# Patient Record
Sex: Male | Born: 1940
Health system: Southern US, Community
[De-identification: ages and names within clinical notes are randomized; demographics above are authoritative.]

## PROBLEM LIST (undated history)

## (undated) DIAGNOSIS — E7800 Pure hypercholesterolemia, unspecified: Secondary | ICD-10-CM

## (undated) DIAGNOSIS — N400 Enlarged prostate without lower urinary tract symptoms: Secondary | ICD-10-CM

## (undated) DIAGNOSIS — I10 Essential (primary) hypertension: Secondary | ICD-10-CM

## (undated) DIAGNOSIS — I4892 Unspecified atrial flutter: Secondary | ICD-10-CM

## (undated) HISTORY — DX: Essential (primary) hypertension: I10

## (undated) HISTORY — DX: Pure hypercholesterolemia, unspecified: E78.00

## (undated) HISTORY — PX: CARDIOVERSION: SHX1299

## (undated) HISTORY — DX: Benign prostatic hyperplasia without lower urinary tract symptoms: N40.0

## (undated) HISTORY — DX: Unspecified atrial flutter: I48.92

---

## 2001-12-02 ENCOUNTER — Emergency Department (HOSPITAL_COMMUNITY): Admission: EM | Admit: 2001-12-02 | Discharge: 2001-12-02 | Payer: Self-pay | Admitting: *Deleted

## 2001-12-09 ENCOUNTER — Inpatient Hospital Stay (HOSPITAL_COMMUNITY): Admission: EM | Admit: 2001-12-09 | Discharge: 2001-12-11 | Payer: Self-pay | Admitting: Emergency Medicine

## 2001-12-09 ENCOUNTER — Encounter: Payer: Self-pay | Admitting: Neurology

## 2001-12-13 ENCOUNTER — Inpatient Hospital Stay (HOSPITAL_COMMUNITY): Admission: EM | Admit: 2001-12-13 | Discharge: 2001-12-16 | Payer: Self-pay | Admitting: Emergency Medicine

## 2001-12-13 ENCOUNTER — Encounter: Payer: Self-pay | Admitting: Internal Medicine

## 2001-12-13 ENCOUNTER — Encounter: Payer: Self-pay | Admitting: Pulmonary Disease

## 2001-12-14 ENCOUNTER — Encounter: Payer: Self-pay | Admitting: Pulmonary Disease

## 2001-12-15 ENCOUNTER — Encounter: Payer: Self-pay | Admitting: Neurology

## 2002-01-10 ENCOUNTER — Ambulatory Visit (HOSPITAL_BASED_OUTPATIENT_CLINIC_OR_DEPARTMENT_OTHER): Admission: RE | Admit: 2002-01-10 | Discharge: 2002-01-10 | Payer: Self-pay | Admitting: *Deleted

## 2008-05-12 ENCOUNTER — Ambulatory Visit: Payer: Self-pay | Admitting: Internal Medicine

## 2008-05-12 ENCOUNTER — Emergency Department (HOSPITAL_COMMUNITY): Admission: EM | Admit: 2008-05-12 | Discharge: 2008-05-12 | Payer: Self-pay | Admitting: Emergency Medicine

## 2008-05-15 ENCOUNTER — Ambulatory Visit: Payer: Self-pay | Admitting: Internal Medicine

## 2008-05-18 ENCOUNTER — Encounter: Payer: Self-pay | Admitting: Internal Medicine

## 2008-05-18 ENCOUNTER — Ambulatory Visit (HOSPITAL_COMMUNITY): Admission: RE | Admit: 2008-05-18 | Discharge: 2008-05-19 | Payer: Self-pay | Admitting: Internal Medicine

## 2008-05-18 ENCOUNTER — Ambulatory Visit: Payer: Self-pay | Admitting: Internal Medicine

## 2008-05-22 ENCOUNTER — Ambulatory Visit: Payer: Self-pay | Admitting: Cardiology

## 2008-05-25 ENCOUNTER — Ambulatory Visit: Payer: Self-pay | Admitting: Cardiology

## 2008-06-05 ENCOUNTER — Ambulatory Visit: Payer: Self-pay | Admitting: Cardiology

## 2008-06-12 ENCOUNTER — Ambulatory Visit: Payer: Self-pay | Admitting: Cardiovascular Disease

## 2008-06-15 ENCOUNTER — Ambulatory Visit: Payer: Self-pay | Admitting: Internal Medicine

## 2008-08-03 ENCOUNTER — Ambulatory Visit: Payer: Self-pay | Admitting: Internal Medicine

## 2009-08-03 ENCOUNTER — Encounter: Admission: RE | Admit: 2009-08-03 | Discharge: 2009-08-03 | Payer: Self-pay | Admitting: Family Medicine

## 2009-08-15 ENCOUNTER — Encounter: Admission: RE | Admit: 2009-08-15 | Discharge: 2009-08-15 | Payer: Self-pay | Admitting: Neurological Surgery

## 2009-08-23 ENCOUNTER — Ambulatory Visit (HOSPITAL_COMMUNITY): Admission: RE | Admit: 2009-08-23 | Discharge: 2009-08-24 | Payer: Self-pay | Admitting: Neurological Surgery

## 2009-11-24 HISTORY — PX: CERVICAL SPINE SURGERY: SHX589

## 2009-11-29 ENCOUNTER — Ambulatory Visit (HOSPITAL_COMMUNITY): Admission: RE | Admit: 2009-11-29 | Discharge: 2009-11-29 | Payer: Self-pay | Admitting: General Surgery

## 2010-03-28 ENCOUNTER — Encounter: Admission: RE | Admit: 2010-03-28 | Discharge: 2010-03-28 | Payer: Self-pay | Admitting: Neurological Surgery

## 2010-12-16 ENCOUNTER — Encounter: Payer: Self-pay | Admitting: Family Medicine

## 2011-02-09 LAB — COMPREHENSIVE METABOLIC PANEL
ALT: 21 U/L (ref 0–53)
AST: 27 U/L (ref 0–37)
Albumin: 3.9 g/dL (ref 3.5–5.2)
Alkaline Phosphatase: 72 U/L (ref 39–117)
BUN: 15 mg/dL (ref 6–23)
CO2: 33 mEq/L — ABNORMAL HIGH (ref 19–32)
Calcium: 9.3 mg/dL (ref 8.4–10.5)
Chloride: 106 mEq/L (ref 96–112)
Creatinine, Ser: 1.1 mg/dL (ref 0.4–1.5)
GFR calc Af Amer: >60 mL/min (ref >60)
GFR calc non Af Amer: >60 mL/min (ref >60)
Glucose, Bld: 151 mg/dL — ABNORMAL HIGH (ref 70–99)
Potassium: 4.7 mEq/L (ref 3.5–5.1)
Sodium: 145 mEq/L (ref 135–145)
Total Bilirubin: 0.8 mg/dL (ref 0.3–1.2)
Total Protein: 7.2 g/dL (ref 6.0–8.3)

## 2011-02-09 LAB — DIFFERENTIAL
Basophils Absolute: 0.1 10*3/uL (ref 0.0–0.1)
Basophils Relative: 1 % (ref 0–1)
Eosinophils Absolute: 0.1 10*3/uL (ref 0.0–0.7)
Eosinophils Relative: 2 % (ref 0–5)
Lymphocytes Relative: 25 % (ref 12–46)
Lymphs Abs: 1.1 10*3/uL (ref 0.7–4.0)
Monocytes Absolute: 0.3 10*3/uL (ref 0.1–1.0)
Monocytes Relative: 6 % (ref 3–12)
Neutro Abs: 2.9 10*3/uL (ref 1.7–7.7)
Neutrophils Relative %: 66 % (ref 43–77)

## 2011-02-09 LAB — CBC
HCT: 45.7 % (ref 39.0–52.0)
Hemoglobin: 15.3 g/dL (ref 13.0–17.0)
MCHC: 33.5 g/dL (ref 30.0–36.0)
MCV: 93.1 fL (ref 78.0–100.0)
Platelets: 160 10*3/uL (ref 150–400)
RBC: 4.9 MIL/uL (ref 4.22–5.81)
RDW: 13.2 % (ref 11.5–15.5)
WBC: 4.4 10*3/uL (ref 4.0–10.5)

## 2011-02-28 LAB — BASIC METABOLIC PANEL
BUN: 20 mg/dL (ref 6–23)
CO2: 28 mEq/L (ref 19–32)
Calcium: 8.8 mg/dL (ref 8.4–10.5)
Chloride: 104 mEq/L (ref 96–112)
Creatinine, Ser: 1.08 mg/dL (ref 0.4–1.5)
GFR calc Af Amer: >60 mL/min (ref >60)
GFR calc non Af Amer: >60 mL/min (ref >60)
Glucose, Bld: 101 mg/dL — ABNORMAL HIGH (ref 70–99)
Potassium: 4.2 mEq/L (ref 3.5–5.1)
Sodium: 138 mEq/L (ref 135–145)

## 2011-02-28 LAB — DIFFERENTIAL
Basophils Absolute: 0 10*3/uL (ref 0.0–0.1)
Basophils Relative: 0 % (ref 0–1)
Eosinophils Absolute: 0.4 10*3/uL (ref 0.0–0.7)
Eosinophils Relative: 8 % — ABNORMAL HIGH (ref 0–5)
Lymphocytes Relative: 27 % (ref 12–46)
Lymphs Abs: 1.4 10*3/uL (ref 0.7–4.0)
Monocytes Absolute: 0.4 10*3/uL (ref 0.1–1.0)
Monocytes Relative: 7 % (ref 3–12)
Neutro Abs: 3 10*3/uL (ref 1.7–7.7)
Neutrophils Relative %: 57 % (ref 43–77)

## 2011-02-28 LAB — CBC
HCT: 43.6 % (ref 39.0–52.0)
Hemoglobin: 15 g/dL (ref 13.0–17.0)
MCHC: 34.4 g/dL (ref 30.0–36.0)
MCV: 92.8 fL (ref 78.0–100.0)
Platelets: 191 10*3/uL (ref 150–400)
RBC: 4.7 MIL/uL (ref 4.22–5.81)
RDW: 14.1 % (ref 11.5–15.5)
WBC: 5.2 10*3/uL (ref 4.0–10.5)

## 2011-02-28 LAB — APTT: aPTT: 25 seconds (ref 24–37)

## 2011-02-28 LAB — PROTIME-INR
INR: 1 (ref 0.00–1.49)
Prothrombin Time: 12.7 seconds (ref 11.6–15.2)

## 2011-04-08 NOTE — Assessment & Plan Note (Signed)
[facility]                         ELECTROPHYSIOLOGY OFFICE NOTE   DUAYNE, BRIDEAU                       MRN:          [id number]  DATE:08/03/2008                            DOB:          January 10, 1941    Mr. Gulley returns today for followup.  He is a very pleasant 70 year old  male with atrial flutter status post catheter ablation which was carried  out several months ago.  On postprocedure, the patient continued to  experience severe fatigue and weakness for unclear reasons.  Since then,  he has improved.  He has been under much stress and then wonders whether  or not stress played a roll on his feelings of fatigue and malaise.  His  main complaint today is not that of palpitations, but rather difficulty  with sleeping and problems with excessive nocturia, typically x3 or 4  per night.  He states that typically about 3 or 4 in the morning, he  will wake up to urinate and then cannot go back to sleep.  The patient  had no specific complaints; otherwise, denied chest pain or shortness of  breath.   CURRENT MEDICATIONS:  1. Simvastatin 20 a day.  2. Multiple vitamin.  3. HCTZ 25 a day.  4. Sotalol 80 twice a day.   PHYSICAL EXAMINATION:  GENERAL:  He is a pleasant well-appearing man in  no acute distress.  VITAL SIGNS:  The blood pressure today was 120/68, the pulse was 54 and  regular, respirations were 18, and weight was 219 pounds.  NECK:  No jugular venous distention.  LUNGS:  Clear bilaterally to auscultation.  No wheezes, rales, or  rhonchi are present.  CARDIOVASCULAR:  Regular rate and rhythm.  Normal S1 and S2.  EXTREMITIES:  No edema.   Pacemaker EKG demonstrates sinus bradycardia.   IMPRESSION:  1. Atrial flutter status post ablation.  2. Sinus bradycardia (asymptomatic).  3. Hypertension, now well controlled.   DISCUSSION:  Mr. Olenik has improved.  I have asked that he followup with  Dr. Theresia Bough for additional treatment of  his prostate hypertrophy and  nocturia.  With regard to his difficulty with sleeping, I will defer  that to Dr. Theresia Bough as well.  I will plan to see him back on a p.r.n.  basis.       Doylene Canning. Ladona Ridgel, MD  Electronically Signed    GWT/MedQ  DD: 08/03/2008  DT: 08/04/2008  Job #: [id number]

## 2011-04-08 NOTE — Assessment & Plan Note (Signed)
[facility]                         ELECTROPHYSIOLOGY OFFICE NOTE   HUXTON, GLAUS                       MRN:          [id number]  DATE:06/15/2008                            DOB:          [birth year]    [name] returns today for followup.  He is a very pleasant 70 year old  man who has a history of hypertension, dyslipidemia, and migraine  headaches, who was found to have atrial flutter back in May.  He  subsequently underwent catheter ablation of his atrial flutter after  transesophageal echo demonstrated no left atrial appendage thrombus.  He  returns today for followup.  He has had intermittent palpitations since  his ablation was carried out, otherwise he has been stable.  He notes  that for the last several days, however, that he has felt fatigue and  weak.  He has had lack of energy and overall just having feelings of  malaise.  He notes shortness of breath with exertion.  He has had some  sensations of nausea and sickness with his stomach.  He denies frank  fevers and chills.   MEDICATIONS:  1. Coumadin as directed.  2. Metoprolol 50 twice a day.  3. Simvastatin 20 a day.  4. HCTZ 25 a day.  5. Metoprolol 50 mg daily.   PHYSICAL EXAMINATION:  GENERAL:  He is a pleasant, well-appearing,  middle-aged man who looks somewhat younger than his stated age.  VITAL SIGNS:  Blood pressure was 125/88, pulse was 60 and regular,  respirations were 18, and weight was 222 pounds.  NECK:  Revealed no jugular venous distention.  LUNGS:  Clear bilaterally to auscultation.  No wheezes, rales, or  rhonchi are present.  CARDIOVASCULAR:  Regular rate and rhythm.  Normal S1 and S2.  PMI was  not enlarged nor was it laterally displaced.  ABDOMEN:  Soft, nontender, and nondistended.  There is no organomegaly  present.  EXTREMITIES:  Demonstrated no cyanosis, clubbing, or edema.  Pulses are  2+ and symmetric.   IMAGING:  EKG today demonstrates sinus  rhythm with sinus bradycardia.  Normal axis and intervals.  The patient because of his above symptoms  was walked with pulse oximetry.  His heart rate increased from the 60s  to the high 70s.  His oxygen saturation remained in the mid 90s without  decrease.   IMPRESSION:  1. Atrial flutter status post catheter ablation.  2. Chronic Coumadin therapy.  Plan today would be to stop Coumadin.  3. Feelings of fatigue and malaise and shortness of breath.  The      causes of Mr. Lamboy' symptoms of fatigue and malaise are unclear to      me.  He may be still deconditioned.  I have asked that he go      through a period of watchful waiting, and if his symptoms worsen or      if he has high fevers or chills, an additional evaluation would be      required.   We will hold off on blood work today, but if his symptoms do worsen, a  CBC  and a BMP would be recommended.  Today, I have asked that he  decrease his metoprolol to 25 mg taking at nighttime.  Obviously, we  will watch his blood pressure to make sure it does not go up.  I have  asked that he come back to see me in several weeks.     Doylene Canning. Ladona Ridgel, MD  Electronically Signed    GWT/MedQ  DD: 06/15/2008  DT: 06/16/2008  Job #: [id number]

## 2011-04-08 NOTE — Consult Note (Signed)
NAMESIMPSON, PAULOS NO.:  [id number]   MEDICAL RECORD NO.:  [id number]          PATIENT TYPE:  EMS   LOCATION:  MAJO                         FACILITY:  [facility]   PHYSICIAN:  Theodore Demark, PA-C   DATE OF BIRTH:  1941/01/02   DATE OF CONSULTATION:  05/12/2008  DATE OF DISCHARGE:  05/12/2008                                 CONSULTATION   PRIMARY CARE PHYSICIAN:  Dell Ponto, MD, at [facility] on [address].   PRIMARY CARDIOLOGIST:  Doylene Canning. Ladona Ridgel, MD.   CHIEF COMPLAINT:  Palpitations and shortness of breath.   HISTORY OF PRESENT ILLNESS:  Joshua Arroyo is a 70 year old male with no  previous history of coronary artery disease.  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  He stated he felt unusually exhausted and had slight  chest pain as well as palpitations.  Sunday, he felt okay, but Monday at  7 a.m., he was walking which he normally does for exercise and again  experienced extreme shortness of breath with being unable to walk up a  hill without stopping to rest.  He was also noticing palpitations and  some slight chest tightness.  His symptoms improved with rest to the  point that he was able to walk home.  He has been going to work and  functioning, but at a reduced level of exertion because of fatigue and  dyspnea on exertion as well as palpitations.  His activity has been  significantly limited by his symptoms.  He saw his primary care  physician and was started on a beta blocker and scheduled with Dr.  Ladona Ridgel at [facility].  However, he became concerned when his  symptoms did not resolve and he was continuing to feel tired and short  of breath.  He came to the emergency room where he was in atrial flutter  and cardiology was asked to evaluate him.   PAST MEDICAL HISTORY:  1. Hypertension.  2. Hyperlipidemia.  3. Family history of coronary artery disease.  4. Migraines.  5. BPH.  6. Degenerative joint disease.  7. Status post 2-D  echocardiogram in 2003, when being evaluated for      headaches which showed an EF of 55-60% and mild aortic      regurgitation.   SURGICAL HISTORY:  None.   ALLERGIES:  None.   CURRENT MEDICATIONS:  1. Advil 200 mg nightly.  2. Zocor 20 mg a day.  3. HCTZ 25 mg a day.  4. Metoprolol 50 mg b.i.d. started this week.  5. Lisinopril 40 mg b.i.d., but this has been on hold since the beta      blocker was added.   SOCIAL HISTORY:  He lives in [place] with his wife and works as a  IT trainer.  He denies any history of alcohol, tobacco, or drug abuse, and  tries to walk 45 minutes a day 5 days a week.   FAMILY HISTORY:  His mother died at age 57 with no history of heart  disease.  His father died at age 66 with a  history of coronary artery  disease, but no siblings have heart disease.   REVIEW OF SYSTEMS:  He has had no fevers, chills, or sweats.  The  dyspnea on exertion is as described above, but he has not had orthopnea,  PND, or edema.  He has not had presyncope or syncope.  There has been no  coughing or wheezing.  He has occasional arthralgias.  He denies reflux  symptoms, hematemesis, hemoptysis, or melena.  Full 14-point review of  systems is otherwise negative.   PHYSICAL EXAMINATION:  VITAL SIGNS:  Temperature is 97.0, blood pressure  117/77, pulse 69, respiratory rate 18, O2 saturation 99% on room air.  GENERAL:  He is a well-developed, well-nourished white male in no acute  distress at rest.  HEENT:  Normal.  NECK:  There is no lymphadenopathy, thyromegaly, bruit, or JVD noted.  CVA:  His heart is irregular in rate and rhythm with an S1 and S2, and  no significant murmur, rub, or gallop is noted.  Distal pulses are  intact in all 4 extremities and no femoral bruits are appreciated.  LUNGS:  Essentially clear to auscultation bilaterally.  SKIN:  No rashes or lesions are noted.  ABDOMEN:  Soft and nontender with active bowel sounds.  There is no  splenomegaly by palpation.   EXTREMITIES:  There is no cyanosis, clubbing, or edema noted.  MUSCULOSKELETAL:  There is no joint deformity or effusions and no spine  or CVA tenderness.  NEURO:  He is alert and oriented.  Cranial nerves II through XII grossly  intact.   Chest x-ray shows mild bibasilar atelectasis.   EKG is atrial flutter rate 68 with variable conduction.   LABORATORY VALUES:  Hemoglobin 14.9, hematocrit 42.6, WBCs 4.7, and  platelets 181.  Sodium 137, potassium 4.4, chloride 105, BUN 27,  creatinine 1.4, glucose 108.  Point care markers negative x1.   IMPRESSION:  Atrial flutter.  Joshua Arroyo has been seen and examined.  His  symptoms are likely due to the atrial flutter.  However, he does not  appear unstable as his heart rate and blood pressure are well  controlled.  Dr. Kathrynn Speed has treated him appropriately and we will add  only Coumadin to his medication regimen.  Because his blood pressure is  within normal limits, we will continue to hold the lisinopril.  He was  initially scheduled to follow up with EP in July; however, Dr. Ladona Ridgel  had a cancellation on May 15, 2008, and Joshua Arroyo has been given this  appointment.  Dr. Ladona Ridgel cannot determine any further evaluation and  testing needed.  He is a good candidate for a TEE with radiofrequency  catheter ablation, but may need to an echo and Myoview prior to this.  We will also check a TSH.      Theodore Demark, PA-C     RB/MEDQ  D:  05/12/2008  T:  05/13/2008  Job:  [id number]   cc:   Dell Ponto, MD

## 2011-04-08 NOTE — Discharge Summary (Signed)
Joshua Arroyo, Joshua NO.:  [id number]   MEDICAL RECORD NO.:  [id number]          PATIENT TYPE:  OIB   LOCATION:  6533                         FACILITY:  [facility]   PHYSICIAN:  Doylene Canning. Ladona Ridgel, MD    DATE OF BIRTH:  1941/10/30   DATE OF ADMISSION:  05/18/2008  DATE OF DISCHARGE:  05/19/2008                               DISCHARGE SUMMARY   This patient has no known drug allergies.   FINAL DIAGNOSES:  1. Discharging day 1 status post transesophageal echocardiogram which      found no left atrial appendage clot.  2. Discharging day 1 status post electrophysiology study,      radiofrequency catheter ablation of a counterclockwise tricuspid      annulus re-entry atrial flutter.  3. The patient with symptomatic atrial flutter.  4. Identified the patient with short of breath, fatigue, palpitations,      and chest tightness.  5. Emergency room visit June 19 started on Coumadin therapy and      Coumadin subtherapeutic on admission.  Home with Lovenox bridge to      his therapeutic Coumadin protime.  6. 2-D echocardiogram and stress test to be arranged after atrial      flutter ablation at [facility].   SECONDARY DIAGNOSES:  1. Hypertension.  2. Dyslipidemia.  3. Family history of coronary artery disease.  4. Migraines.  5. Benign prostatic hypertrophy.  6. Degenerative joint disease.  7. Echocardiogram 2003, ejection fraction of 55-60%.   PROCEDURE:  May 18, 2008, electrophysiology study, radiofrequency  catheter ablation of typical counterclockwise tricuspid annulus reentry,  atrial flutter, Dr. Lewayne Bunting.  The patient is in sinus rhythm at  discharge and has had no postprocedural complications 24 hours after the  procedure.   BRIEF HISTORY:  Joshua Arroyo is a 70 year old male.  He has a history of  hypertension and dyslipidemia and a family history of coronary artery  disease.  He exercises on a regular basis, but in the last week or so  has noted  increasing dyspnea with exertion.   On June 19, he went to the emergency room.  He was found to be in atrial  flutter with rapid ventricular response.  The patient has been on a beta  blocker.  He was started on Coumadin.  He was referred to see Dr. Ladona Ridgel  an office visit May 15, 2008.  Dr. Ladona Ridgel proposed electrophysiology  study and radiofrequency catheter ablation with a transesophageal  echocardiogram prior to the procedure.  The patient is willing to  undergo this.  The risks and benefits have been described to him.   HOSPITAL COURSE:  The patient presents electively on May 18, 2008.  A  transesophageal echocardiogram was negative for left atrial appendage  thrombus and the electrophysiology study went forward.  A typical  counterclockwise atrial flutter was identified and bidirectional block  placed in the cavotricuspid isthmus by Dr. Lewayne Bunting.  The patient  was started on IV heparin after the procedure.  His Coumadin was  subtherapeutic on admission.  He was given increased doses of  Coumadin  on the day of the procedure, the day after the procedure, and over the  weekend.  He will also be supported with Lovenox 150 mg subcu daily on  Saturday and Sunday.  He was also given a subcu injection prior to  leaving the hospital on June 26.   DISCHARGE MEDICATIONS:  1. Enteric-coated aspirin 321 mg daily.  2. Lovenox prefilled syringes 150 mg daily both Saturday and Sunday.  3. Metoprolol 50 mg a new dose 1/2 tablet in the morning and 1/2      tablet in the evening, and possible wean in the future.  4. Coumadin 5 mg tablets, 2 tablets Saturday June 27, 1-1-1/2 tablet      Sunday, June 28, and then presenting to the Coumadin Clinic Monday,      June 29 at 3:15.  5. Simvastatin 20 mg daily at bedtime.  6. Hydrochlorothiazide 25 mg daily.  7. Multivitamin with iron daily.   Follow-up at [facility], [address];  1. Coumadin Clinic Monday, June 29 at 3:15  p.m.  2. To see Dr. Ladona Ridgel Thursday, July 23, at 9:15.   LABORATORY:  Her studies pertinent to this admission on admission for  the electrophysiology study June 25, protime was 15.1, INR 1.2.  complete blood count:  White cells of 4.3, hemoglobin 15.1, hematocrit  43.6, and platelets of 164.  Serum electrolytes:  Sodium 137, potassium  4.1, chloride 106, carbonate 24, glucose 105, BUN is 20, and creatinine  1.12.      Joshua Mirza, PA      Doylene Canning. Ladona Ridgel, MD  Electronically Signed    GM/MEDQ  D:  05/19/2008  T:  05/20/2008  Job:  [id number]   cc:   Dell Ponto, MD.

## 2011-04-08 NOTE — Op Note (Signed)
NAMENEEL, BUFFONE NO.:  [id number]   MEDICAL RECORD NO.:  [id number]          PATIENT TYPE:  OIB   LOCATION:  6533                         FACILITY:  [facility]   PHYSICIAN:  Doylene Canning. Ladona Ridgel, MD    DATE OF BIRTH:  05/27/1941   DATE OF PROCEDURE:  05/18/2008  DATE OF DISCHARGE:                               OPERATIVE REPORT   PROCEDURE PERFORMED:  Electrophysiology study and RF catheter ablation  of atrial flutter.   HISTORY:  The patient is a 70 year old male with a history of atrial  flutter diagnosed approximately 1 week ago.  He was started on Coumadin.  He underwent transesophageal echo demonstrating no left atrial appendage  thrombus.  He is now referred for catheter ablation.   PROCEDURE:  After informed consent was obtained, the patient was taken  to the diagnostic EP lab in fasting state.  After usual preparation and  draping, intravenous fentanyl and midazolam was given for sedation.  A 6-  [place] hexapolar catheter was inserted percutaneously in the right  jugular vein and advanced to the coronary sinus.  A 7-French multipolar  halo catheter was inserted percutaneously in the right femoral vein and  advanced to right atrium.  A 5-French quadripolar catheter was inserted  percutaneously in the right femoral vein and advanced this to the bundle  region.  After measuring basic intervals, mapping was carried out  demonstrating typical atrial flutter cycle length 250 msec.  A 7-French  quadripolar ablation catheter was then maneuvered into the right atrium  and mapping was again carried out.  This demonstrated a normal size and  orientation of the patient's atrial flutter isthmus.  A total of the 10  RF energy applications were subsequently delivered.  During the fourth  RF energy application, atrial flutter was terminated and sinus rhythm  was restored.  During this RF energy application, initial isthmus block  was demonstrated.  A fifth RF energy  application was delivered and the  patient was observed for approximately 20 minutes at which time, the  patient's flutter isthmus conduction returned.  Five additional bonus RF  energy applications were then delivered resulting in isthmus block.  At  this point, the patient was observed for 30 minutes and during this  time, rapid atrial pacing was carried out from the coronary sinus and  higher atrium.  A pacing cycle lengths of 490 msec and stepwise  decreased down to 40 msec where AV Wenckebach was observed.  Programmed  atrial stimulation was carried out from the coronary sinus with the base  drive cycle length of 045 msec with the S1-S2 interval stepwise  decreased down to 320 msec where AV node ERP was observed.  During  programmed atrial stimulation, there are no AH jumps and no echo beats.  No inducible SVT noted.  Rapid ventricular pacing was carried out from  the right ventricle by way of the ablation catheter, demonstrating VA  dissociation at 600 msec.  At this point, the patient was observed, but  had developed recurrent atrial flutter isthmus conduction and 5  additional RF energy applications  were delivered resulting in recreation  of atrial flutter isthmus block.  At this point, the catheters were  removed.  Hemostasis was assured, and the patient was returned to his  room in satisfactory condition.   COMPLICATIONS:  There were no immediate ECG complications.   RESULTS:  4A.  Baseline ECG.  Baseline ECG demonstrates atrial flutter  with rapid ventricular response.  The atrial flutter cycle length was  250 msec.  4B.  The HV interval was 55 msec.  QRS duration was 115 msec.  4C.  Rapid ventricular pacing:  Following ablation, rapid ventricular  pacing was carried out from the RV apex demonstrating VA dissociation at  600 msec.  4D.  Programmed ventricular stimulation.  Programmed atrial stimulation  was carried out the RV apex at base drive cycle length of 811 msec   demonstrating VA dissociation.  4E.  Rapid atrial pacing:  Following ablation, rapid atrial pacing was  carried out from the coronary sinus and the right atrium demonstrating  AV Wenckebach cycle length of 400 msec.  During rapid atrial pacing, the  P interval was less than the R interval and there was no inducible SVT.  53F.  Programmed atrial stimulation.  Programmed atrial stimulation was  carried out from the coronary sinus and high atrium baseline cycle  length of 500 msec. The S1-S2 interval stepwise decreased down to 320  msec where the AV node ERP was observed.  During programmed atrial  stimulation, there were no AA shunts and echo beats.  4G.  Arrhythmias observed.  Atrial flutter initiation, present at the  time of VP study, duration was sustained, cycle length was 250 msec.  Method of termination was catheter ablation.  4H.  Mapping.  Mapping of the atrial flutter isthmus demonstrated the  usual size and orientation.  4I.  RF energy applications. Total of 10 RF energy applications were  delivered.  During the fourth RF energy application, atrial flutter was  demonstrated and sinus rhythm restored and isthmus block demonstrated.  Bonus RF energy applications was delivered and the patient was observed,  but had recurrence of atrial flutter isthmus conduction at approximately  30 minutes after the ablation.  Five additional RF energy applications  were delivered resulting in creation of isthmus block.   CONCLUSIONS:  This study demonstrates successful electrophysiologic  study and RF catheter ablation, typical atrial flutter with the total of  10 RF energy applications delivered resulting in termination of flutter,  restoration of sinus rhythm, creation of bidirectional block in atrial  flutter isthmus.      Doylene Canning. Ladona Ridgel, MD  Electronically Signed     GWT/MEDQ  D:  05/18/2008  T:  05/19/2008  Job:  [id number]   cc:   Dell Ponto, M.D.

## 2011-04-08 NOTE — Letter (Signed)
May 15, 2008    Dell Ponto, MD  [facility]  [address]  [place], [place]  [address]   RE:  Joshua, Arroyo  MRN:  [id number]  /  DOB:  Oct 04, 1941   Dear Dr. Kathrynn Speed:   Thank you for referring [name] for cardiology and EP  evaluation.  As you know, he is a very pleasant 70 year old man whose  health has been quite good.  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  The patient does have hypertension and dyslipidemia and a  family history of coronary artery disease.  He states that he exercises  on a regular basis, but in the last week or so has noted increasing  dyspnea with exertion.  He was seen in the emergency room at [facility] on May 12, 2008, at which time he was found to be in atrial  flutter with a rapid ventricular response.  The patient has been treated  with beta blocker therapy and started on Coumadin.  He is referred for  additional evaluation.  He notes that over the last week or 2 weeks his  dyspnea with exertion has worsened markedly, although he does note mild  dyspnea of unknown etiology in the past.  He has never had syncope.  He  denies anginal symptoms.   SOCIAL HISTORY:  Notable, the patient is married and has 3 children.  He  denies tobacco use.  He drinks 3 alcoholic beverages per week by his  report.   FAMILY HISTORY:  Notable for both parents being deceased.  His father  had cancer and died in his 4s and mother in her 66s.   REVIEW OF SYSTEMS:  Notable for occasional dyspnea as previously noted  and some anxiety, otherwise, all systems were reviewed and negative  except as noted in the HPI.   PHYSICAL EXAMINATION:  GENERAL:  He is a pleasant 70 year old man, in no  acute distress.  He was somewhat anxious appearing.  VITAL SIGNS:  Blood pressure was 127/81, the pulse was 91 and irregular,  respirations were 18, and weight was 231 pounds.  HEENT:  Normocephalic  and atraumatic.  Pupils were equal and round.   Oropharynx is moist.  Sclerae anicteric.  NECK:  No jugular venous distention. No thyromegaly.  Trachea was  midline.  Carotid 2+ symmetric.  LUNGS:  Clear bilaterally to auscultation.  No wheezes, rales, or  rhonchi are present.  There is no increased work of breathing.  CARDIOVASCULAR:  Irregular rhythm.  Normal S1 and S2.  There are no  murmurs, rubs, or gallops.  The PMI was not enlarged or laterally  displaced.  ABDOMEN:  Soft, nontender, and nondistended.  There is no organomegaly.  Bowel sounds are present.  There is no rebound or guarding.  EXTREMITIES:  No cyanosis, clubbing, or edema.  Pulses are 2+ and  symmetric.  NEUROLOGIC:  Alert and oriented x2.  His cranial nerves are intact.  Strength is 5/5 and symmetric.   EKG demonstrates atrial flutter with a controlled ventricular response  at 91 beats per minute.  The atrial cycle length was approximately 240  milliseconds.   IMPRESSION:  1. Symptomatic atrial flutter.  2. New initiation of Coumadin therapy.  3. Hypertension.  4. Dyslipidemia.   DISCUSSION:  Dr. Kathrynn Speed, I have discussed the treatment options with the  patient.  The risks, benefits, goals, and expectations of  electrophysiologic study and catheter ablation have been discussed with  the patient.  He would like to proceed with this.  He will call us to  schedule at the earliest possible convenient time.  The patient tells me  today that he has been scheduled at your Center to have a 2-D echo and a  stress test to be done.  Because of the difficulties in obtaining the  studies, particularly with relationship to their accuracy in patients  with uncontrolled atrial flutter, my recommendation at this time would  be to go ahead and take care of his flutter and then I will refer him  back to you, and the further decision for echo and stress testing based  on how the patient is doing following his ablation procedure.    Sincerely,      Doylene Canning. Ladona Ridgel, MD   Electronically Signed   Joshua Arroyo  DD: 05/15/2008  DT: 05/16/2008  Job #: [id number]

## 2011-04-11 NOTE — Discharge Summary (Signed)
[place]  Patient:    Joshua Arroyo, Joshua Arroyo Visit Number: [id number] MRN: [id number]          Service Type: Attending:  Lacretia Leigh. Ninetta Lights, M.D. Dictated by:   Gertha Calkin, M.D. Adm. Date:  12/13/01 Disc. Date: 12/16/01                             Discharge Summary  DATE OF BIRTH:  March 11, 1941.  DISCHARGE DIAGNOSES: 1. Migraine headache. 2. Hypertension. 3. Benign prostatic hypertrophy. 4. Degenerative joint disease.  DISCHARGE MEDICATIONS: 1. Imitrex 100 mg p.o. q.d. times six days with onset of headaches. 2. Propranolol 20 mg p.o. t.i.d. 3. Clonazepam 0.5 mg p.o. b.i.d.  Please note above medications were written by Dr. Sandria Manly.  PROCEDURES: 1. Transthoracic echocardiogram with impression (a) ejection fraction 55 to 65%, left ventricular function normal.  No left ventricular wall motion    abnormalities.  Left ventricular wall thickness was upper limits of    normal. (b) Aortic valve thickness was mildly increased. Mild aortic    valvular regurgitation.  Aortic regurgitation was 1 to 2+ on a 0-4+ scale.    (c) Mild mitral valve regurgitation.  This procedure was performed on    12/15/2001. 2. Magnetic resonance imaging of abdomen without contrast, performed on    December 15, 2001:    a. Suggestive of adrenal adenoma left side.  Patient recommended to    have a follow up limited non-contrast CT scan in three months if he has    a history of lung cancer.  However, if no symptomatology secondary to    possible theo, the patient needs no further imaging studies.    b. Small splenic cysts and several small peripelvic cysts of the left    kidney. 3. CT scan head without contrast showing minimal atrophy, questionable    minimal small vessel chronic ischemic changes of cerebral white    matter. No acute intracranial abnormalities. 4. Previous to magnetic resonance imaging CT scan was done 12/15/01 showing    a left lower adrenal nodule measuring  1 cm in size. Magnetic resonance    imaging was performed as follow up to this test.  BRIEF HISTORY OF PRESENT ILLNESS:  The patient is a white male with history of severe headaches discharged on 12/11/01 from [facility] for the same symptoms and returned to the emergency department complaining of recurrent headache involving both temples and eyes, ears.  The onset was about 7 p.m. the night before, unrelieved with Imitrex subcu times two doses and Vicodin two tablets.  This was associated with nausea and vomiting.  On 12/09/01 the patient had a magnetic resonance imaging with MRA showing no ICA. Two weeks of progressive worsening.  Headache varies in location and no associated visual changes, weakness or numbness.  The patient denies any heavy nonsteroidal anti-inflammatory drug use.  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  PAST MEDICAL HISTORY:  Negative for hypertension, coronary artery disease.  MEDICATIONS: 1. Depakote, received in emergency department 500 mg p.o. q.d. 2. Imitrex 500 mg p.o. q.d. one times dose. 3. Imitrex subcu p.r.n.  PHYSICAL EXAMINATION: VITAL SIGNS:  Temperature 97.1, blood pressure 214/103, pulse 61, respirations 20, 92% saturations on room air.  Repeat blood pressure showed manual 151/81.  GENERAL:  Generally a somnolent white male in no acute distress, status post Phenergan 25  and Dilaudid 4.  NEUROLOGICAL:  Alert and oriented, intact times four.  Cranial nerves II-XII were intact.  No weakness or sensory deficits bilateral upper and lower extremities.  Toes are downgoing.  LUNGS:  Clear with good air movement.  CARDIOVASCULAR:  Regular rate and rhythm with normal S1 and S2.  ABDOMEN:  Soft, nontender with good bowel sounds.  LABORATORY DATA:  CBC:  White blood cell count 3.3, hemoglobin 15.8, platelet count 184K.  BMP:  136 sodium, 2.5 potassium, chloride 100, bicarb 29,  BUN 21, creatinine 1.2, glucose 131.  TSH:  2.45.  ESR:  Negative.  HOSPITAL COURSE: #1 - HEADACHE, MIGRAINE PAIN:  Patient likely showing secondary symptoms of hypertension.  The patient was admitted previously and started on short term pain medications for headache, however, his hypertensive diagnosis was unknown.  At this time we started the patient on low dose beta blocker to prophyax for migraines as well as treat his hypertension.  Throughout his hospital course his headaches seemed to correlate with blood pressures that were elevated.  However, the patient also has a past medical history of having migraines.  For this reason we continued his p.r.n. medications for headache pain.  #2 - HYPERTENSION:  See medical regimen above.  #3 - BENIGN PROSTATIC HYPERTROPHY:  This is followed by Dr. Vernie Ammons and refer any further work-up as outpatient basis.  #4 - DEGENERATIVE JOINT DISEASE:  The patient had no complaints and we did not alter any chronic nonsteroidal anti-inflammatory drug medications he may or may not have been using at home. Dictated by:   Gertha Calkin, M.D. Attending:  Lacretia Leigh. Ninetta Lights, M.D. DD:  03/01/02 TD:  03/02/02 Job: [id number]

## 2011-04-11 NOTE — Consult Note (Signed)
[place]  Patient:    Joshua Arroyo, Joshua Arroyo Visit Number: [id number] MRN: [id number]          Service Type: MED Location: 3000 [id number] Attending Physician:  Anise Salvo Dictated by:   Genene Churn. Love, M.D. Proc. Date: 12/09/01 Admit Date:  12/08/2001   CC:         Lionel December, M.D.   Consultation Report  PATIENT ADDRESS:  [address] [place], [place] [address].  DATE OF BIRTH:  07-17-1941.  REASON FOR CONSULTATION:  This 70 year old, right-handed, white, married male is seen at the request of Dr. Karilyn Cota for evaluation of headaches.  HISTORY OF PRESENT ILLNESS:  This patient has had a three to four-week history of waxing and waning severe headaches occurring in the right neck and occipital region but more recently varying in location.  The headaches can occur in the periorbital, bifrontal, and occipital region without associated visual loss but at times with nausea.  A trial of tapering prednisone over the last week has been without benefit.  He has had no associated ptosis, nasal stuffiness, or tearing.  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  He notes no family history of headache.  Over the last four weeks, the headaches have varied in intensity.  He has had a CT scan of the brain, raising the question of a right parietal stroke; an MRI study of the cervical spine showing spondylitic disease at C4-5 and C5-6; an MRI study of the brain which has been reported to show some evidence of small vessel ischemic disease.  The patient has no known family history of aneurysms.  Besides trials of steroids, he has been on over-the-counter medications and recently was tried on nasal oxygen without any benefit.   He notes that he has been under stress and more recently noted increase in headache after visiting his best friends mothers funeral in [place], [place].  MEDICATIONS:  He is currently not on medications.  ALLERGIES:  He has no known drug allergies.  HABIT:  He drinks alcohol socially, does not smoke cigarettes.  PAST MEDICAL HISTORY:  He has had no significant hospitalizations, injuries, or operations.  He has been tried on amitriptyline recently.  PHYSICAL EXAMINATION:  GENERAL:  Well-developed, pleasant, white male in no acute distress.  VITAL SIGNS:  Blood pressure right and left arm 140/80 and 130/80, heart rate 64 and regular.  NECK:   There were no bruits.  Neck flexion and extension maneuvers were unremarkable.  NEUROLOGIC:  Mental Status:  He was alert and oriented x 3, followed one, two, and three-step commands.  Cranial nerve examination revealed visual fields full, disks flat, extraocular movements full, corneals present.  Facial sensation equal.  No facial motor asymmetry.  Hearing present.  Air conduction greater than bone conduction.  Tongue midline, uvula midline.  Gags present. Sternocleidomastoid and trapezius testing normal.  Motor examination revealed 5/5 strength proximally and distally in the upper and lower extremities without any evidence of proximal, pronator, or distal drift.  Coordination testing with finger-to-nose, heel-to-shin, and rapid alternating movements normal.  Sensory examination intact to pinprick, touch, change of position, and vibration testing.  Deep tendon reflexes 1 to 2+, and plantar responses were downgoing.  IMPRESSION:  History of headaches with supposedly recent sedimentation rate and MRI study of the brain which had been normal.  May consider the possibility of MR angiography but will start a protocol for his headaches with DHE, Decadron, and Reglan. Dictated by:   Genene Churn. Love, M.D. Attending Physician:  Anise Salvo DD:  12/09/01 TD:  12/11/01 Job: [id number]

## 2011-04-11 NOTE — Discharge Summary (Signed)
[place]. [facility]  Patient:    Joshua Arroyo, Joshua Arroyo Visit Number: [id number] MRN: [id number]          Service Type: MED Location: 2300 2308 01 Attending Physician:  Alfonso Ramus Dictated by:   Jennette Kettle, M.D. Admit Date:  12/13/2001 Disc. Date: 12/11/01   CC:         Candy Sledge, M.D.  Genene Churn. Love, M.D.  Veverly Fells. Vernie Ammons, M.D.   Discharge Summary  DISCHARGE DIAGNOSES: 1. Migraine headaches. 2. History of hypertension. 3. History of enlarged prostate seen by Dr. Loraine Leriche C. Ottelin. 4. Degenerative disc disease in cervical spine cervical vertebrae-4 through    cervical vertebrae-6 seen by Dr. Burnett Corrente, [facility]. 5. Questionable history of obstruction with sleep apnea. 6. History of erectile dysfunction.  DISCHARGE MEDICATIONS: 1. Depakote ER 500 mg p.o. q.d. 2. Imitrex subcutaneous injections p.r.n. when Mr. Ballin develops a headache.    If no relief, may repeat in one hour but no more than two injections in    a 24-hour period. 3. Reglan 10 mg p.o. p.r.n.  PROCEDURES/DIAGNOSTIC STUDIES:  The patient had a MRA of the brain on December 09, 2001 per Dr. Genene Churn. Love with the impression being limited MRI showing no acute stroke, small vessel-type changes in the cerebellum with white mater of the cerebral hemispheres.  There was no intracranial MR angiography abnormalities with respect to large and medium-sized vessels.  FOLLOW-UP:  The patient is to call [facility] at [phone] for further follow up with Dr. Genene Churn. Love or Dr. Candy Sledge.  The patient is to continue following up with Dr. Loraine Leriche C. Ottelin in urology. The patient currently has no primary care physician and states that he would consider going down to the [facility] to see Dr. Jennette Kettle.  The patient was given telephone number.  HISTORY OF PRESENT ILLNESS:  Briefly, Joshua Arroyo is a 70 year old white  male who presented to the ED on December 08, 2001 with chief complaint of worsening headaches over the past four weeks.  The patient had been seen by Dr. Burnett Corrente for degenerative disc disease in his cervical spine and was recommended for CT and MRI in December 2002.  The patient did obtain these and was seen Dr. Candy Sledge on two occasions prior to the current admission regarding his headaches.  The patient was given prescription for Vicodin at that time.  The patient went to the emergency room on the day of admission with worsening headaches with no relief from the Vicodin.  The patient was previously placed on Prednisone taper by Dr. Candy Sledge for which he took prior to admission with little to no relief.  LABORATORY DATA:  The patient had electrolytes revealing sodium 136, potassium 3.5, chloride 100, bicarb 29, BUN 21, creatinine 1.2, glucose 131, calcium of 8.4.  The patients CBC revealed a white count of 6.3, hemoglobin 15.8, and platelets of 184,000.  The patients TSH was within normal limits at 2.447. The patient did have a sedimentation rate performed on the day prior to admission which was 0 (of note the patient was on prednisone).  The patient had a urinalysis on the day prior to admission which was negative for nitrites and leukocytes as well as glucose, bilirubin, ketones, and blood.  HOSPITAL COURSE: #1 - HEADACHES:  The patient was admitted with severe headaches which have been occurring over the last three to four weeks and has previously  been seen by [facility], Dr. Candy Sledge.  Neurology was reconsulted on this patient and Dr. Genene Churn. Love saw the patient, who thought the patient had migraine headaches.  The patient did have a MRA performed which showed no evidence of any aneurysm.  Prior CT and MRI results were reviewed by Dr. Genene Churn. Love.  In addition to migraines, the patients headaches could be secondary from his degenerative  disc disease in his cervical spine and hormonal related given that he has a history of his erectile dysfunction per Dr. Genene Churn. Loves suggestions.  Furthermore, the patient has been under a significant amount of stress recently in his home life.  This could be contributing to some of his headaches.  The patient was started on DHE protocol for his headaches as well as Decadron. The patient was also placed on Reglan for bowel irritation while on DHE protocol.  The patient was subsequently taught Imitrex injections that he can take at home for acute exacerbations of his migraines.  The patient was discharged home on Depakote 500 mg q.d. per Dr. Genene Churn. Love with further follow up with [facility], Dr. Candy Sledge or Dr. Genene Churn. Sandria Manly.  #2 - HYPERTENSION:  The patient does not have a previous history of hypertension.  The patients blood pressure was somewhat elevated upon admission.  It ranged from the systolics of 150s to 200.  This may be essential hypertension or it may be secondary to his pain from headaches. Further follow up with primary care to re-evaluate his hypertension.  #3 - HISTORY OF ERECTILE DYSFUNCTION:  The patient has previously been given Viagra per Dr. Loraine Leriche C. Ottelin as stated by the patient.  I question as to whether they be some association with the patients hormonal level associated with his headaches.  This was briefly discussed with Dr. Genene Churn. Sandria Manly.  #4 - DEGENERATIVE DISC DISEASE:  The patient is currently seeing a chiropractor, Dr. Burnett Corrente, and the patient desires to return to Dr. Burnett Corrente for further therapy.  #5 - QUESTIONABLE HISTORY OF OBSTRUCTIVE SLEEP APNEA:  The patients body habitus is somewhat suggestive of possible obstructive sleep apnea.  The patient does snore at night and does admit to some daytime somnolence.  Further evaluation as an outpatient is needed. Dictated by:   Jennette Kettle, M.D. Attending Physician:   Alfonso Ramus DD:  12/12/01 TD:  12/13/01 Job: [id number]

## 2011-08-21 LAB — COMPREHENSIVE METABOLIC PANEL
ALT: 22
AST: 23
Albumin: 3.7
Alkaline Phosphatase: 53
BUN: 25 — ABNORMAL HIGH
CO2: 26
Calcium: 8.6
Chloride: 105
Creatinine, Ser: 1.09
GFR calc Af Amer: >60
GFR calc non Af Amer: >60
Glucose, Bld: 106 — ABNORMAL HIGH
Potassium: 4.5
Sodium: 138
Total Bilirubin: 0.9
Total Protein: 6.2

## 2011-08-21 LAB — BASIC METABOLIC PANEL
BUN: 20
CO2: 24
Calcium: 8.2 — ABNORMAL LOW
Chloride: 106
Creatinine, Ser: 1.12
GFR calc Af Amer: >60
GFR calc non Af Amer: >60
Glucose, Bld: 105 — ABNORMAL HIGH
Potassium: 4.1
Sodium: 137

## 2011-08-21 LAB — POCT CARDIAC MARKERS
CKMB, poc: <1 — ABNORMAL LOW
Myoglobin, poc: 60.7
Operator id: 294501
Troponin i, poc: <0.05

## 2011-08-21 LAB — DIFFERENTIAL
Basophils Absolute: 0
Basophils Relative: 1
Eosinophils Absolute: 0.1
Eosinophils Relative: 2
Lymphocytes Relative: 25
Lymphs Abs: 1.2
Monocytes Absolute: 0.4
Monocytes Relative: 8
Neutro Abs: 3.1
Neutrophils Relative %: 65

## 2011-08-21 LAB — CBC
HCT: 38.6 — ABNORMAL LOW
HCT: 42.6
HCT: 43.6
Hemoglobin: 13
Hemoglobin: 14.9
Hemoglobin: 15.1
MCHC: 33.8
MCHC: 34.5
MCHC: 35.1
MCV: 89.4
MCV: 90.1
MCV: 90.4
Platelets: 151
Platelets: 164
Platelets: 181
RBC: 4.28
RBC: 4.71
RBC: 4.88
RDW: 13.5
RDW: 13.9
RDW: 14.4
WBC: 4.3
WBC: 4.7
WBC: 6.1

## 2011-08-21 LAB — POCT I-STAT, CHEM 8
BUN: 27 — ABNORMAL HIGH
Calcium, Ion: 1.03 — ABNORMAL LOW
Chloride: 105
Creatinine, Ser: 1.4
Glucose, Bld: 108 — ABNORMAL HIGH
HCT: 44
Hemoglobin: 15
Potassium: 4.4
Sodium: 137
TCO2: 22

## 2011-08-21 LAB — PROTIME-INR
INR: 1.1
INR: 1.2
INR: 1.3
Prothrombin Time: 14
Prothrombin Time: 15.1
Prothrombin Time: 16 — ABNORMAL HIGH

## 2011-08-21 LAB — TSH: TSH: 1.871

## 2011-08-21 LAB — CK TOTAL AND CKMB (NOT AT ARMC)
CK, MB: 2.9
Relative Index: 1.7
Total CK: 170

## 2011-08-21 LAB — TROPONIN I: Troponin I: 0.03

## 2011-08-21 LAB — HEPARIN LEVEL (UNFRACTIONATED)
Heparin Unfractionated: 0.52
Heparin Unfractionated: <0.1 — ABNORMAL LOW

## 2011-08-21 LAB — APTT
aPTT: 25
aPTT: 27

## 2012-11-30 ENCOUNTER — Other Ambulatory Visit: Payer: Self-pay | Admitting: Diagnostic Neuroimaging

## 2012-11-30 DIAGNOSIS — R413 Other amnesia: Secondary | ICD-10-CM

## 2012-11-30 DIAGNOSIS — R6889 Other general symptoms and signs: Secondary | ICD-10-CM

## 2012-12-11 ENCOUNTER — Ambulatory Visit
Admission: RE | Admit: 2012-12-11 | Discharge: 2012-12-11 | Disposition: A | Discharge status: Home / Self Care | Payer: Medicare Other | Source: Ambulatory Visit | Attending: Diagnostic Neuroimaging | Admitting: Diagnostic Neuroimaging

## 2012-12-11 DIAGNOSIS — R413 Other amnesia: Secondary | ICD-10-CM

## 2012-12-11 DIAGNOSIS — R6889 Other general symptoms and signs: Secondary | ICD-10-CM

## 2013-05-17 ENCOUNTER — Ambulatory Visit (INDEPENDENT_AMBULATORY_CARE_PROVIDER_SITE_OTHER): Payer: Medicare Other | Admitting: Diagnostic Neuroimaging

## 2013-05-17 ENCOUNTER — Encounter: Payer: Self-pay | Admitting: Diagnostic Neuroimaging

## 2013-05-17 VITALS — BP 132/73 | HR 50 | Ht 73.0 in | Wt 239.0 lb

## 2013-05-17 DIAGNOSIS — G479 Sleep disorder, unspecified: Secondary | ICD-10-CM

## 2013-05-17 DIAGNOSIS — G3184 Mild cognitive impairment, so stated: Secondary | ICD-10-CM

## 2013-05-17 NOTE — Progress Notes (Signed)
[facility]  PATIENT: Joshua Arroyo DOB: 09/03/1941  REFERRING CLINICIAN:  HISTORY FROM: patient REASON FOR VISIT: follow up   HISTORICAL  CHIEF COMPLAINT:  Chief Complaint  Patient presents with  . Follow-up    memory loss    HISTORY OF PRESENT ILLNESS:   UPDATE 05/17/13: Since last visit, doing about the same. Still with intermittent memory lapses. Some sleep issues continue. Endorses some mild anxiety type symptoms.   PRIOR HPI (11/30/12): 72 year old right-handed male, with hypercholesterolemia, hypertension, atrial flutter status post ablation 2009, benign prostatic hyperplasia, here for evaluation of memory problems times one year.  Patient reports gradual progressive short-term memory problems. He has difficulty with recall of location of objects such as his keys, tasks, names of people. He is currently working as an Airline pilot, self-employed and his memory problems has not affected his work.  Patient reports a least one year history of sleep disturbance. Previously this was related to frequent urination in the middle the night. Now he is on medication for BPH. 6 months ago he started taking over-the-counter sleep aid (diphenhydramine). This seems to have helped. He continues to wake up 0-1 time per night.  REVIEW OF SYSTEMS: Full 14 system review of systems performed and notable only for as per HPI.  ALLERGIES: No Known Allergies  HOME MEDICATIONS: Outpatient Prescriptions Prior to Visit  Medication Sig Dispense Refill  . aspirin 81 MG tablet Take 81 mg by mouth daily.      . Cholecalciferol (VITAMIN D3) 1000 UNITS CAPS Take by mouth.      . DiphenhydrAMINE HCl, Sleep, (NIGHTTIME SLEEP AID) 50 MG tablet Take 50 mg by mouth at bedtime as needed for sleep.      . hydrochlorothiazide (HYDRODIURIL) 25 MG tablet Take 25 mg by mouth daily.      . Multiple Vitamins-Minerals (ONE DAILY MENS HEALTH PO) Take by mouth.      . OMEGA-3 FATTY ACIDS PO Take 980  mg by mouth daily.      . pravastatin (PRAVACHOL) 40 MG tablet Take 40 mg by mouth daily.      . sildenafil (VIAGRA) 100 MG tablet Take 100 mg by mouth as needed for erectile dysfunction.      . sotalol (BETAPACE) 80 MG tablet Take 80 mg by mouth 2 (two) times daily.      . tamsulosin (FLOMAX) 0.4 MG CAPS Take 0.4 mg by mouth.       No facility-administered medications prior to visit.    PAST MEDICAL HISTORY: Past Medical History  Diagnosis Date  . Hypertension   . Hypercholesteremia   . Atrial flutter     post ablation  . BPH (benign prostatic hyperplasia)     PAST SURGICAL HISTORY: Past Surgical History  Procedure Laterality Date  . Cervical spine surgery  2011    FAMILY HISTORY: Family History  Problem Relation Age of Onset  . Heart Problems Father     SOCIAL HISTORY:  History   Social History  . Marital Status: Married    Spouse Name: Lynden Ang    Number of Children: 3  . Years of Education: MA   Occupational History  . Not on file.   Social History Main Topics  . Smoking status: Never Smoker   . Smokeless tobacco: Never Used  . Alcohol Use: Yes     Comment: Socially  . Drug Use: No  . Sexually Active: Not on file   Other Topics Concern  . Not on file  Social History Narrative   Pt lives at home with his spouse.   Caffeine Use: 1 cup of coffee daily     PHYSICAL EXAM  Filed Vitals:   05/17/13 1513  BP: 132/73  Pulse: 50  Height: 6\' 1"  (1.854 m)  Weight: 239 lb (108.41 kg)    Not recorded    Body mass index is 31.54 kg/(m^2).  GENERAL EXAM: Patient is in no distress  CARDIOVASCULAR: Regular rate and rhythm, no murmurs, no carotid bruits  NEUROLOGIC: MENTAL STATUS: awake, alert, language fluent, comprehension intact, naming intact; MOCA 24/30. AFT 12.  CRANIAL NERVE: pupils equal and reactive to light, visual fields full to confrontation, extraocular muscles intact, no nystagmus, facial sensation and strength symmetric, uvula midline,  shoulder shrug symmetric, tongue midline. MOTOR: normal bulk and tone, full strength in the BUE, BLE SENSORY: normal and symmetric to light touch COORDINATION: finger-nose-finger, fine finger movements normal REFLEXES: deep tendon reflexes present and symmetric GAIT/STATION: narrow based gait; able to walk on toes, heels and tandem; romberg is negative   DIAGNOSTIC DATA (LABS, IMAGING, TESTING) - I reviewed patient records, labs, notes, testing and imaging myself where available.  Lab Results  Component Value Date   WBC 4.4 11/27/2009   HGB 15.3 11/27/2009   HCT 45.7 11/27/2009   MCV 93.1 11/27/2009   PLT 160 11/27/2009      Component Value Date/Time   NA 145 11/27/2009 1325   K 4.7 11/27/2009 1325   CL 106 11/27/2009 1325   CO2 33* 11/27/2009 1325   GLUCOSE 151* 11/27/2009 1325   BUN 15 11/27/2009 1325   CREATININE 1.10 11/27/2009 1325   CALCIUM 9.3 11/27/2009 1325   PROT 7.2 11/27/2009 1325   ALBUMIN 3.9 11/27/2009 1325   AST 27 11/27/2009 1325   ALT 21 11/27/2009 1325   ALKPHOS 72 11/27/2009 1325   BILITOT 0.8 11/27/2009 1325   GFRNONAA >60 11/27/2009 1325   GFRAA  Value: >60        The eGFR has been calculated using the MDRD equation. This calculation has not been validated in all clinical situations. eGFR's persistently <60 mL/min signify possible Chronic Kidney Disease. 11/27/2009 1325   No results found for this basename: CHOL, HDL, LDLCALC, LDLDIRECT, TRIG, CHOLHDL   No results found for this basename: HGBA1C   No results found for this basename: VITAMINB12   Lab Results  Component Value Date   TSH 1.871 **Test methodology is 3rd generation TSH** 05/12/2008    12/11/12 MRI brain - Mild periventricular and subcortical foci of chronic small vessel ischemic disease. Chronic lacunar infarction (4mm) in the right cerebellar hemisphere.   ASSESSMENT AND PLAN  72 y.o. year old male  has a past medical history of Hypertension; Hypercholesteremia; Atrial flutter; and BPH (benign prostatic hyperplasia). here  with mild memory difficulty. MOCA 24/30, stable over past 6 months. Previously MMSE 29/30, MOCA 24/40, GDS 0, no frontal release signs in Jan 2014.  Ddx: mild cognitive impairment vs sleep disturbance vs normal aging  PLAN: 1. diet / exercise / mental activities reviewed   Suanne Marker, MD 05/17/2013, 3:54 PM Certified in Neurology, Neurophysiology and Neuroimaging  [facility] [address] [place], [place] [address] [phone]

## 2013-05-17 NOTE — Patient Instructions (Addendum)
Observation. Increase physical activity as tolerated.

## 2013-06-07 ENCOUNTER — Encounter: Payer: Self-pay | Admitting: Internal Medicine

## 2013-06-07 ENCOUNTER — Ambulatory Visit (INDEPENDENT_AMBULATORY_CARE_PROVIDER_SITE_OTHER): Payer: Medicare Other | Admitting: Internal Medicine

## 2013-06-07 VITALS — BP 130/80 | HR 66 | Ht 74.0 in | Wt 234.4 lb

## 2013-06-07 DIAGNOSIS — I4892 Unspecified atrial flutter: Secondary | ICD-10-CM

## 2013-06-07 DIAGNOSIS — I471 Supraventricular tachycardia: Secondary | ICD-10-CM | POA: Insufficient documentation

## 2013-06-07 DIAGNOSIS — I498 Other specified cardiac arrhythmias: Secondary | ICD-10-CM

## 2013-06-07 MED ORDER — SOTALOL HCL 80 MG PO TABS: 80.0000 mg | ORAL_TABLET | Freq: Two times a day (BID) | ORAL | Status: DC

## 2013-06-07 NOTE — Assessment & Plan Note (Signed)
Since the patient has been off of his sotalol, he has had worsening palpitations. He notes that while on sotalol, he had no palpitations, and his blood pressure was better controlled. Today we discussed the treatment options. During examination, I noted at times a regular rhythm, but at other times a more regular tachycardia rhythm. Subsequent repeat ECG demonstrated atrial tachycardia. I suspect this arrhythmia is originating from his pulmonary veins and would likely lead recurrent arrhythmias, likely atrial fibrillation. After discussing the various treatment options with the patient with include a beta blocker or sotalol a combination beta blocker potassium channel blocker, I've recommended reinitiation of sotalol. He is taking a very low dose at 80 mg twice daily. This dose could be up titrated. We will have him back in several weeks for repeat EKG and follow up with me thereafter.

## 2013-06-07 NOTE — Progress Notes (Signed)
HPI Joshua Arroyo returns today for followup. He is a very pleasant 72 year old man with a history of atrial flutter, status post catheter ablation. At the time of his ablation, there was concern that he might be susceptible to atrial fibrillation and was placed on sotalol. For the last 5 years, he is maintaining sinus rhythm very nicely. He has had essentially no palpitations. The patient stop taking sotalol approximately 7 days ago. Since then, his blood pressure has been elevated, but he has noted recurrent palpitations. No syncope, and no chest pain. No peripheral edema. The palpitations are particularly severe during times of anxiety. The patient had to get a public speech several days ago and almost had to sit down because of symptomatic palpitations. No Known Allergies   Current Outpatient Prescriptions  Medication Sig Dispense Refill  . aspirin 81 MG tablet Take 81 mg by mouth daily.      . Cholecalciferol (VITAMIN D3) 1000 UNITS CAPS Take by mouth.      . hydrochlorothiazide (HYDRODIURIL) 25 MG tablet Take 25 mg by mouth daily.      . mometasone (NASONEX) 50 MCG/ACT nasal spray Place 2 sprays into the nose daily.      . Multiple Vitamins-Minerals (ONE DAILY MENS HEALTH PO) Take by mouth.      . OMEGA-3 FATTY ACIDS PO Take 980 mg by mouth daily.      . pravastatin (PRAVACHOL) 40 MG tablet Take 40 mg by mouth daily.      . sildenafil (VIAGRA) 100 MG tablet Take 100 mg by mouth as needed for erectile dysfunction.      . tamsulosin (FLOMAX) 0.4 MG CAPS Take 0.4 mg by mouth daily.      . DiphenhydrAMINE HCl, Sleep, (NIGHTTIME SLEEP AID) 50 MG tablet Take 50 mg by mouth at bedtime as needed for sleep.      . sotalol (BETAPACE) 80 MG tablet Take 1 tablet (80 mg total) by mouth 2 (two) times daily.  180 tablet  3   No current facility-administered medications for this visit.     Past Medical History  Diagnosis Date  . Hypertension   . Hypercholesteremia   . Atrial flutter     post ablation   . BPH (benign prostatic hyperplasia)     ROS:   All systems reviewed and negative except as noted in the HPI.   Past Surgical History  Procedure Laterality Date  . Cervical spine surgery  2011     Family History  Problem Relation Age of Onset  . Heart Problems Father      History   Social History  . Marital Status: Married    Spouse Name: Lynden Ang    Number of Children: 3  . Years of Education: MA   Occupational History  . Not on file.   Social History Main Topics  . Smoking status: Never Smoker   . Smokeless tobacco: Never Used  . Alcohol Use: Yes     Comment: Socially  . Drug Use: No  . Sexually Active: Not on file   Other Topics Concern  . Not on file   Social History Narrative   Pt lives at home with his spouse.   Caffeine Use: 1 cup of coffee daily     BP 130/80  Pulse 66  Ht 6\' 2"  (1.88 m)  Wt 234 lb 6.4 oz (106.323 kg)  BMI 30.08 kg/m2  Physical Exam:  Well appearing 72 year old man, NAD HEENT: Unremarkable Neck:  6 cm JVD, no  thyromegally Back:  No CVA tenderness Lungs:  Clear with no wheezes, rales, or rhonchi. HEART:  Regular rate rhythm, no murmurs, no rubs, no clicks Abd:  soft, positive bowel sounds, no organomegally, no rebound, no guarding Ext:  2 plus pulses, no edema, no cyanosis, no clubbing Skin:  No rashes no nodules Neuro:  CN II through XII intact, motor grossly intact  EKG - #1 normal sinus rhythm with sinus arrhythmia  #2 atrial tachycardia transitioning to sinus rhythm   Assess/Plan:

## 2013-06-07 NOTE — Patient Instructions (Signed)
Your physician recommends that you schedule a follow-up appointment in: 3 months with Dr Johney Frame  Your physician has recommended you make the following change in your medication:  1) Start Sotalol 80mg  twice daily

## 2013-08-24 ENCOUNTER — Encounter: Payer: Self-pay | Admitting: Internal Medicine

## 2013-08-24 ENCOUNTER — Ambulatory Visit (INDEPENDENT_AMBULATORY_CARE_PROVIDER_SITE_OTHER): Payer: Medicare Other | Admitting: Internal Medicine

## 2013-08-24 VITALS — BP 133/79 | HR 50 | Ht 74.0 in | Wt 232.0 lb

## 2013-08-24 DIAGNOSIS — I498 Other specified cardiac arrhythmias: Secondary | ICD-10-CM

## 2013-08-24 DIAGNOSIS — I471 Supraventricular tachycardia: Secondary | ICD-10-CM

## 2013-08-24 NOTE — Patient Instructions (Addendum)
Your physician wants you to follow-up in: 6 months with Dr. Taylor. You will receive a reminder letter in the mail two months in advance. If you don't receive a letter, please call our office to schedule the follow-up appointment.  Your physician recommends that you continue on your current medications as directed. Please refer to the Current Medication list given to you today.  

## 2013-08-24 NOTE — Progress Notes (Signed)
Primary EP: Ladona Ridgel  Mr. Reppond returns today for followup. He is a very pleasant 72 year old man with a history of atrial flutter, status post catheter ablation by Dr Ladona Ridgel several years ago. At the time of his ablation, there was concern that he might be susceptible to atrial fibrillation and was placed on sotalol. For the last 5 years, he is maintaining sinus rhythm very nicely. He has had essentially no palpitations. The patient stop taking sotalol in June.  He did not have any clinical benefit off of this medicine. He was evaluated by Dr Ladona Ridgel and felt to have an atrial tachycardia (I do not see an ekg of this in Epic).  He was restarted on sotalol.  He has done well since that time, without symptoms of arrhythmias.   No Known Allergies   Current Outpatient Prescriptions  Medication Sig Dispense Refill  . aspirin 81 MG tablet Take 81 mg by mouth daily.      . Cholecalciferol (VITAMIN D3) 1000 UNITS CAPS Take by mouth.      . DiphenhydrAMINE HCl, Sleep, (NIGHTTIME SLEEP AID) 50 MG tablet Take 50 mg by mouth at bedtime as needed for sleep.      . hydrochlorothiazide (HYDRODIURIL) 25 MG tablet Take 25 mg by mouth daily.      . Multiple Vitamins-Minerals (ONE DAILY MENS HEALTH PO) Take by mouth.      . OMEGA-3 FATTY ACIDS PO Take 980 mg by mouth. Take three daily      . pravastatin (PRAVACHOL) 40 MG tablet Take 40 mg by mouth daily.      . sildenafil (VIAGRA) 100 MG tablet Take 100 mg by mouth as needed for erectile dysfunction.      . sotalol (BETAPACE) 80 MG tablet Take 1 tablet (80 mg total) by mouth 2 (two) times daily.  180 tablet  3  . tamsulosin (FLOMAX) 0.4 MG CAPS Take 0.4 mg by mouth daily.       No current facility-administered medications for this visit.     Past Medical History  Diagnosis Date  . Hypertension   . Hypercholesteremia   . Atrial flutter     post ablation  . BPH (benign prostatic hyperplasia)     ROS:   All systems reviewed and negative except as  noted in the HPI.   Past Surgical History  Procedure Laterality Date  . Cervical spine surgery  2011     Family History  Problem Relation Age of Onset  . Heart Problems Father      History   Social History  . Marital Status: Married    Spouse Name: Lynden Ang    Number of Children: 3  . Years of Education: MA   Occupational History  . Not on file.   Social History Main Topics  . Smoking status: Never Smoker   . Smokeless tobacco: Never Used  . Alcohol Use: Yes     Comment: Socially  . Drug Use: No  . Sexual Activity: Not on file   Other Topics Concern  . Not on file   Social History Narrative   Pt lives at home with his spouse.   Caffeine Use: 1 cup of coffee daily     Physical Exam: Filed Vitals:   08/24/13 1056  BP: 133/79  Pulse: 50  Height: 6\' 2"  (1.88 m)  Weight: 232 lb (105.235 kg)    GEN- The patient is well appearing, alert and oriented x 3 today.   Head- normocephalic, atraumatic  Eyes-  Sclera clear, conjunctiva pink Ears- hearing intact Oropharynx- clear Neck- supple, no JVP Lymph- no cervical lymphadenopathy Lungs- Clear to ausculation bilaterally, normal work of breathing Heart- Regular rate and rhythm, no murmurs, rubs or gallops, PMI not laterally displaced GI- soft, NT, ND, + BS Extremities- no clubbing, cyanosis, or edema MS- no significant deformity or atrophy Skin- no rash or lesion Psych- euthymic mood, full affect Neuro- strength and sensation are intact  ekg today reveals sinus bradycardia 50 bpm, PR 206, QTc 437  Assessment and Plan:  1. Atrial tachycardia/ atrial fibrillation Presently well controlled with sotalol.  He denies symptoms with sotalol though he is bradycardic today. Therapeutic strategies for atrial arrhythmias including medicine and ablation were discussed in detail with the patient today. Risk, benefits, and alternatives to EP study and radiofrequency ablation for afib were also discussed in detail today. As he  is doing well on sotalol, he is not interested in ablation at this time.  If he fails medical therapy with sotalol then he may be more willing to consider ablation at that time. His CHADS2VASC score is 2.  He is clear that he does not wish to consider anticoagulation at this time. He will continue to follow with Dr Ladona Ridgel and I will see as needed going forward.

## 2014-04-10 ENCOUNTER — Ambulatory Visit (INDEPENDENT_AMBULATORY_CARE_PROVIDER_SITE_OTHER): Payer: Medicare Other | Admitting: Internal Medicine

## 2014-04-10 ENCOUNTER — Encounter: Payer: Self-pay | Admitting: Internal Medicine

## 2014-04-10 VITALS — BP 152/79 | HR 54 | Ht 72.0 in | Wt 238.0 lb

## 2014-04-10 DIAGNOSIS — I4892 Unspecified atrial flutter: Secondary | ICD-10-CM

## 2014-04-10 DIAGNOSIS — I471 Supraventricular tachycardia: Secondary | ICD-10-CM

## 2014-04-10 DIAGNOSIS — I498 Other specified cardiac arrhythmias: Secondary | ICD-10-CM

## 2014-04-10 NOTE — Assessment & Plan Note (Signed)
His arrhythmias are well controlled. He will continue his current meds.

## 2014-04-10 NOTE — Patient Instructions (Signed)
Your physician wants you to follow-up in: 12 months with Dr. Taylor. You will receive a reminder letter in the mail two months in advance. If you don't receive a letter, please call our office to schedule the follow-up appointment.    

## 2014-04-10 NOTE — Progress Notes (Signed)
      HPI Joshua Arroyo returns today for followup. He is a pleasant 73 yo man with a h/o atrial flutter, s/p ablation who developed atrial tachy, likely originating from the left atrium based on P wave morphology. He was started back on sotalol and has done well. His only complaint is that he feels fatigued when he gets up in the morning. No palpitations. No sob. No Known Allergies   Current Outpatient Prescriptions  Medication Sig Dispense Refill  . aspirin 81 MG tablet Take 81 mg by mouth daily.      . Cholecalciferol (VITAMIN D3) 1000 UNITS CAPS Take by mouth.      . DiphenhydrAMINE HCl, Sleep, (NIGHTTIME SLEEP AID) 50 MG tablet Take 50 mg by mouth at bedtime as needed for sleep.      . hydrochlorothiazide (HYDRODIURIL) 25 MG tablet Take 25 mg by mouth daily.      . Multiple Vitamins-Minerals (ONE DAILY MENS HEALTH PO) Take by mouth.      . OMEGA-3 FATTY ACIDS PO Take 980 mg by mouth. Take three daily      . pravastatin (PRAVACHOL) 40 MG tablet Take 40 mg by mouth daily.      . sildenafil (VIAGRA) 100 MG tablet Take 100 mg by mouth as needed for erectile dysfunction.      . sotalol (BETAPACE) 80 MG tablet Take 1 tablet (80 mg total) by mouth 2 (two) times daily.  180 tablet  3  . tamsulosin (FLOMAX) 0.4 MG CAPS Take 0.4 mg by mouth daily.       No current facility-administered medications for this visit.     Past Medical History  Diagnosis Date  . Hypertension   . Hypercholesteremia   . Atrial flutter     post ablation  . BPH (benign prostatic hyperplasia)     ROS:   All systems reviewed and negative except as noted in the HPI.   Past Surgical History  Procedure Laterality Date  . Cervical spine surgery  2011     Family History  Problem Relation Age of Onset  . Heart Problems Father      History   Social History  . Marital Status: Married    Spouse Name: Joshua Arroyo    Number of Children: 3  . Years of Education: MA   Occupational History  . Not on file.    Social History Main Topics  . Smoking status: Never Smoker   . Smokeless tobacco: Never Used  . Alcohol Use: Yes     Comment: Socially  . Drug Use: No  . Sexual Activity: Not on file   Other Topics Concern  . Not on file   Social History Narrative   Pt lives at home with his spouse.   Caffeine Use: 1 cup of coffee daily     BP 152/79  Pulse 54  Ht 6' (1.829 m)  Wt 238 lb (107.956 kg)  BMI 32.27 kg/m2  Physical Exam:  Well appearing NAD HEENT: Unremarkable Neck:  No JVD, no thyromegally Lymphatics:  No adenopathy Back:  No CVA tenderness Lungs:  Clear with no wheezes HEART:  Regular rate rhythm, no murmurs, no rubs, no clicks Abd:  soft, positive bowel sounds, no organomegally, no rebound, no guarding Ext:  2 plus pulses, no edema, no cyanosis, no clubbing Skin:  No rashes no nodules Neuro:  CN II through XII intact, motor grossly intact  EKG - sinus bradycardia.   Assess/Plan:

## 2014-05-22 ENCOUNTER — Other Ambulatory Visit: Payer: Self-pay | Admitting: *Deleted

## 2014-05-22 DIAGNOSIS — I471 Supraventricular tachycardia: Secondary | ICD-10-CM

## 2014-05-22 MED ORDER — SOTALOL HCL 80 MG PO TABS: 80.0000 mg | ORAL_TABLET | Freq: Two times a day (BID) | ORAL | Status: DC

## 2015-04-05 ENCOUNTER — Encounter: Payer: Self-pay | Admitting: Internal Medicine

## 2015-04-05 ENCOUNTER — Ambulatory Visit (INDEPENDENT_AMBULATORY_CARE_PROVIDER_SITE_OTHER): Payer: Medicare Other | Admitting: Internal Medicine

## 2015-04-05 VITALS — BP 144/72 | HR 50 | Ht 72.0 in | Wt 232.0 lb

## 2015-04-05 DIAGNOSIS — I10 Essential (primary) hypertension: Secondary | ICD-10-CM

## 2015-04-05 DIAGNOSIS — I4892 Unspecified atrial flutter: Secondary | ICD-10-CM | POA: Diagnosis not present

## 2015-04-05 DIAGNOSIS — I471 Supraventricular tachycardia: Secondary | ICD-10-CM | POA: Diagnosis not present

## 2015-04-05 NOTE — Assessment & Plan Note (Signed)
His blood pressure has been elevated at times. He is instructed to reduce his ETOH intake, eat less, lose weight and increase his physical activity.

## 2015-04-05 NOTE — Progress Notes (Signed)
      HPI Mr. Glomski returns today for followup. He is a pleasant 74 yo man with a h/o atrial flutter, s/p ablation who developed atrial tachy, likely originating from the left atrium based on P wave morphology. He was started back on sotalol and has done well. He admits to some dietary indiscretion. His blood pressure has been elevated. He has had no palpitations. No sob. No Known Allergies   Current Outpatient Prescriptions  Medication Sig Dispense Refill  . aspirin 81 MG tablet Take 81 mg by mouth daily.    . Cholecalciferol (VITAMIN D3) 1000 UNITS CAPS Take by mouth.    . Coenzyme Q10 (CO Q 10 PO) Take 1 tablet by mouth daily.    . DiphenhydrAMINE HCl, Sleep, (NIGHTTIME SLEEP AID) 50 MG tablet Take 50 mg by mouth at bedtime as needed for sleep.    Marland Kitchen losartan-hydrochlorothiazide (HYZAAR) 100-12.5 MG per tablet Take 1 tablet by mouth every morning.  0  . Multiple Vitamins-Minerals (ONE DAILY MENS HEALTH PO) Take by mouth.    . OMEGA-3 FATTY ACIDS PO Take 980 mg by mouth. Take three daily    . pravastatin (PRAVACHOL) 40 MG tablet Take 40 mg by mouth daily.    . sildenafil (VIAGRA) 100 MG tablet Take 100 mg by mouth as needed for erectile dysfunction.    . sotalol (BETAPACE) 80 MG tablet Take 1 tablet (80 mg total) by mouth 2 (two) times daily. 180 tablet 2  . tamsulosin (FLOMAX) 0.4 MG CAPS Take 0.4 mg by mouth daily.     No current facility-administered medications for this visit.     Past Medical History  Diagnosis Date  . Hypertension   . Hypercholesteremia   . Atrial flutter     post ablation  . BPH (benign prostatic hyperplasia)     ROS:   All systems reviewed and negative except as noted in the HPI.   Past Surgical History  Procedure Laterality Date  . Cervical spine surgery  2011     Family History  Problem Relation Age of Onset  . Heart Problems Father      History   Social History  . Marital Status: Married    Spouse Name: Tye Maryland  . Number of  Children: 3  . Years of Education: MA   Occupational History  . Not on file.   Social History Main Topics  . Smoking status: Never Smoker   . Smokeless tobacco: Never Used  . Alcohol Use: Yes     Comment: Socially  . Drug Use: No  . Sexual Activity: Not on file   Other Topics Concern  . Not on file   Social History Narrative   Pt lives at home with his spouse.   Caffeine Use: 1 cup of coffee daily     BP 144/72 mmHg  Pulse 50  Ht 6' (1.829 m)  Wt 232 lb (105.235 kg)  BMI 31.46 kg/m2  Physical Exam:  Well appearing 74 yo man, NAD HEENT: Unremarkable Neck:  No JVD, no thyromegally Back:  No CVA tenderness Lungs:  Clear with no wheezes HEART:  Regular rate rhythm, no murmurs, no rubs, no clicks Abd:  soft, positive bowel sounds, no organomegally, no rebound, no guarding Ext:  2 plus pulses, no edema, no cyanosis, no clubbing Skin:  No rashes no nodules Neuro:  CN II through XII intact, motor grossly intact  EKG - sinus bradycardia.   Assess/Plan:

## 2015-04-05 NOTE — Patient Instructions (Signed)
Medication Instructions:  Your physician recommends that you continue on your current medications as directed. Please refer to the Current Medication list given to you today.   Labwork: NONE  Testing/Procedures: NONE  Follow-Up: Your physician wants you to follow-up in: 12 months with Dr. Taylor. You will receive a reminder letter in the mail two months in advance. If you don't receive a letter, please call our office to schedule the follow-up appointment.   Any Other Special Instructions Will Be Listed Below (If Applicable).   

## 2015-04-05 NOTE — Assessment & Plan Note (Signed)
He is maintaining NSR very nicely on sotalol. He will continue his current meds.

## 2015-12-04 DIAGNOSIS — Z0000 Encounter for general adult medical examination without abnormal findings: Secondary | ICD-10-CM | POA: Diagnosis not present

## 2015-12-04 DIAGNOSIS — I10 Essential (primary) hypertension: Secondary | ICD-10-CM | POA: Diagnosis not present

## 2015-12-04 DIAGNOSIS — E7800 Pure hypercholesterolemia, unspecified: Secondary | ICD-10-CM | POA: Diagnosis not present

## 2015-12-04 DIAGNOSIS — N400 Enlarged prostate without lower urinary tract symptoms: Secondary | ICD-10-CM | POA: Diagnosis not present

## 2015-12-04 DIAGNOSIS — G4700 Insomnia, unspecified: Secondary | ICD-10-CM | POA: Diagnosis not present

## 2015-12-04 DIAGNOSIS — Z1389 Encounter for screening for other disorder: Secondary | ICD-10-CM | POA: Diagnosis not present

## 2015-12-04 DIAGNOSIS — N529 Male erectile dysfunction, unspecified: Secondary | ICD-10-CM | POA: Diagnosis not present

## 2016-01-17 DIAGNOSIS — R21 Rash and other nonspecific skin eruption: Secondary | ICD-10-CM | POA: Diagnosis not present

## 2016-01-17 DIAGNOSIS — I10 Essential (primary) hypertension: Secondary | ICD-10-CM | POA: Diagnosis not present

## 2016-04-23 DIAGNOSIS — L74513 Primary focal hyperhidrosis, soles: Secondary | ICD-10-CM | POA: Diagnosis not present

## 2016-04-23 DIAGNOSIS — B351 Tinea unguium: Secondary | ICD-10-CM | POA: Diagnosis not present

## 2016-04-23 DIAGNOSIS — B353 Tinea pedis: Secondary | ICD-10-CM | POA: Diagnosis not present

## 2016-04-23 DIAGNOSIS — L559 Sunburn, unspecified: Secondary | ICD-10-CM | POA: Diagnosis not present

## 2016-04-29 ENCOUNTER — Ambulatory Visit (INDEPENDENT_AMBULATORY_CARE_PROVIDER_SITE_OTHER): Payer: Medicare HMO | Admitting: Internal Medicine

## 2016-04-29 ENCOUNTER — Encounter: Payer: Self-pay | Admitting: Internal Medicine

## 2016-04-29 VITALS — BP 128/74 | HR 46 | Ht 72.0 in | Wt 255.4 lb

## 2016-04-29 DIAGNOSIS — I471 Supraventricular tachycardia: Secondary | ICD-10-CM

## 2016-04-29 NOTE — Progress Notes (Signed)
HPI Mr. Knaff returns today for followup. He is a pleasant 75 yo man with a h/o atrial flutter, s/p ablation who developed atrial tachy, likely originating from the left atrium based on P wave morphology. He was started back on sotalol and has done well. He admits to some dietary indiscretion.  He has had no palpitations. Very mild sob. He has had some problems with lower back pain and some parasthesias. Fairly mild. He also notes some peripheral edema which has improved some with reduction of his dose of amlodipine. No Known Allergies   Current Outpatient Prescriptions  Medication Sig Dispense Refill  . amLODipine (NORVASC) 5 MG tablet Take 0.5 tablets by mouth daily.  0  . aspirin 81 MG tablet Take 81 mg by mouth daily.    . DiphenhydrAMINE HCl, Sleep, (NIGHTTIME SLEEP AID) 50 MG tablet Take 50 mg by mouth at bedtime as needed for sleep.    Marland Kitchen FIBER PO Take 2 scoop by mouth daily as needed (fiber).    . furosemide (LASIX) 20 MG tablet Take 1 tablet by mouth daily.  0  . losartan-hydrochlorothiazide (HYZAAR) 100-12.5 MG per tablet Take 1 tablet by mouth every morning.  0  . MAGNESIUM PO Take 2 tablets by mouth daily as needed (magnesium supplement).    . Multiple Vitamins-Minerals (ONE DAILY MENS HEALTH PO) Take 1 capsule by mouth daily.     . OMEGA-3 FATTY ACIDS PO Take 2 tablets by mouth in the morning and 1 tablet in the eveving    . pravastatin (PRAVACHOL) 40 MG tablet Take 40 mg by mouth daily.    Marland Kitchen PROTEIN PO Take 1 scoop by mouth daily as needed (protein supplement).    . sildenafil (VIAGRA) 100 MG tablet Take 100 mg by mouth daily as needed for erectile dysfunction.     . sotalol (BETAPACE) 80 MG tablet Take 1 tablet (80 mg total) by mouth 2 (two) times daily. 180 tablet 2  . tamsulosin (FLOMAX) 0.4 MG CAPS Take 0.4 mg by mouth daily.     No current facility-administered medications for this visit.     Past Medical History  Diagnosis Date  . Hypertension   .  Hypercholesteremia   . Atrial flutter ([place])     post ablation  . BPH (benign prostatic hyperplasia)     ROS:   All systems reviewed and negative except as noted in the HPI.   Past Surgical History  Procedure Laterality Date  . Cervical spine surgery  2011     Family History  Problem Relation Age of Onset  . Heart Problems Father      Social History   Social History  . Marital Status: Married    Spouse Name: Tye [place]  . Number of Children: 3  . Years of Education: MA   Occupational History  . Not on file.   Social History Main Topics  . Smoking status: Never Smoker   . Smokeless tobacco: Never Used  . Alcohol Use: Yes     Comment: Socially  . Drug Use: No  . Sexual Activity: Not on file   Other Topics Concern  . Not on file   Social History Narrative   Pt lives at home with his spouse.   Caffeine Use: 1 cup of coffee daily     BP 128/74 mmHg  Pulse 46  Ht 6' (1.829 m)  Wt 255 lb 6.4 oz (115.849 kg)  BMI 34.63 kg/m2  Physical Exam:  Well  appearing 75 yo man, NAD HEENT: Unremarkable Neck:  6 cm JVD, no thyromegally Back:  No CVA tenderness Lungs:  Clear with no wheezes HEART:  Regular rate rhythm, no murmurs, no rubs, no clicks Abd:  soft, positive bowel sounds, no organomegally, no rebound, no guarding Ext:  2 plus pulses, trace peripheral edema, no cyanosis, no clubbing Skin:  No rashes no nodules Neuro:  CN II through XII intact, motor grossly intact  EKG - sinus bradycardia.   Assess/Plan: 1. Atrial tachycardia - he has been asymptomatic on low dose sotalol. He will continue his current meds. 2. HTN - His blood pressure is well controlled. Will follow. He is encouraged to maintain a low sodium diet. 3. Atrial flutter - he has had no recurrences.  4. Peripheral edema - this is mild and improved with reduction in amlodipine. He could increase his dose of lasix and/or stop amlodipine. For now he will try and reduce his salt intake.   Mikle Bosworth.D.

## 2016-04-29 NOTE — Patient Instructions (Signed)

## 2016-05-19 DIAGNOSIS — L309 Dermatitis, unspecified: Secondary | ICD-10-CM | POA: Diagnosis not present

## 2016-05-19 DIAGNOSIS — D1801 Hemangioma of skin and subcutaneous tissue: Secondary | ICD-10-CM | POA: Diagnosis not present

## 2016-05-19 DIAGNOSIS — B358 Other dermatophytoses: Secondary | ICD-10-CM | POA: Diagnosis not present

## 2016-05-19 DIAGNOSIS — D2271 Melanocytic nevi of right lower limb, including hip: Secondary | ICD-10-CM | POA: Diagnosis not present

## 2016-05-19 DIAGNOSIS — L814 Other melanin hyperpigmentation: Secondary | ICD-10-CM | POA: Diagnosis not present

## 2016-05-19 DIAGNOSIS — L820 Inflamed seborrheic keratosis: Secondary | ICD-10-CM | POA: Diagnosis not present

## 2016-05-19 DIAGNOSIS — L821 Other seborrheic keratosis: Secondary | ICD-10-CM | POA: Diagnosis not present

## 2016-05-19 DIAGNOSIS — L570 Actinic keratosis: Secondary | ICD-10-CM | POA: Diagnosis not present

## 2016-05-19 DIAGNOSIS — D2261 Melanocytic nevi of right upper limb, including shoulder: Secondary | ICD-10-CM | POA: Diagnosis not present

## 2016-05-28 DIAGNOSIS — Z79899 Other long term (current) drug therapy: Secondary | ICD-10-CM | POA: Diagnosis not present

## 2016-05-28 DIAGNOSIS — L309 Dermatitis, unspecified: Secondary | ICD-10-CM | POA: Diagnosis not present

## 2016-06-02 DIAGNOSIS — R609 Edema, unspecified: Secondary | ICD-10-CM | POA: Diagnosis not present

## 2016-06-02 DIAGNOSIS — N400 Enlarged prostate without lower urinary tract symptoms: Secondary | ICD-10-CM | POA: Diagnosis not present

## 2016-06-02 DIAGNOSIS — I10 Essential (primary) hypertension: Secondary | ICD-10-CM | POA: Diagnosis not present

## 2016-07-01 DIAGNOSIS — Z5181 Encounter for therapeutic drug level monitoring: Secondary | ICD-10-CM | POA: Diagnosis not present

## 2016-07-01 DIAGNOSIS — L309 Dermatitis, unspecified: Secondary | ICD-10-CM | POA: Diagnosis not present

## 2016-08-29 DIAGNOSIS — Z23 Encounter for immunization: Secondary | ICD-10-CM | POA: Diagnosis not present

## 2016-12-31 DIAGNOSIS — N400 Enlarged prostate without lower urinary tract symptoms: Secondary | ICD-10-CM | POA: Diagnosis not present

## 2016-12-31 DIAGNOSIS — I10 Essential (primary) hypertension: Secondary | ICD-10-CM | POA: Diagnosis not present

## 2016-12-31 DIAGNOSIS — Z0000 Encounter for general adult medical examination without abnormal findings: Secondary | ICD-10-CM | POA: Diagnosis not present

## 2016-12-31 DIAGNOSIS — Z1389 Encounter for screening for other disorder: Secondary | ICD-10-CM | POA: Diagnosis not present

## 2016-12-31 DIAGNOSIS — L219 Seborrheic dermatitis, unspecified: Secondary | ICD-10-CM | POA: Diagnosis not present

## 2016-12-31 DIAGNOSIS — I4892 Unspecified atrial flutter: Secondary | ICD-10-CM | POA: Diagnosis not present

## 2016-12-31 DIAGNOSIS — R609 Edema, unspecified: Secondary | ICD-10-CM | POA: Diagnosis not present

## 2017-01-26 DIAGNOSIS — E860 Dehydration: Secondary | ICD-10-CM | POA: Diagnosis not present

## 2017-03-12 DIAGNOSIS — B351 Tinea unguium: Secondary | ICD-10-CM | POA: Diagnosis not present

## 2017-06-09 DIAGNOSIS — Z961 Presence of intraocular lens: Secondary | ICD-10-CM | POA: Diagnosis not present

## 2017-07-03 ENCOUNTER — Ambulatory Visit (INDEPENDENT_AMBULATORY_CARE_PROVIDER_SITE_OTHER): Payer: Medicare HMO | Admitting: Physician Assistant

## 2017-07-03 ENCOUNTER — Encounter (INDEPENDENT_AMBULATORY_CARE_PROVIDER_SITE_OTHER): Payer: Self-pay

## 2017-07-03 ENCOUNTER — Encounter: Payer: Self-pay | Admitting: Physician Assistant

## 2017-07-03 VITALS — BP 124/64 | HR 47 | Ht 72.0 in | Wt 239.0 lb

## 2017-07-03 DIAGNOSIS — Z79899 Other long term (current) drug therapy: Secondary | ICD-10-CM

## 2017-07-03 DIAGNOSIS — R001 Bradycardia, unspecified: Secondary | ICD-10-CM | POA: Diagnosis not present

## 2017-07-03 DIAGNOSIS — I471 Supraventricular tachycardia: Secondary | ICD-10-CM | POA: Diagnosis not present

## 2017-07-03 NOTE — Patient Instructions (Signed)
Medication Instructions:   Your physician recommends that you continue on your current medications as directed. Please refer to the Current Medication list given to you today.   If you need a refill on your cardiac medications before your next appointment, please call your pharmacy.  Labwork: NONE ORDERED  TODAY     Testing/Procedures: NONE ORDERED  TODAY    Follow-Up:  Your physician wants you to follow-up in: ONE YEAR WITH  TAYLOR You will receive a reminder letter in the mail two months in advance. If you don't receive a letter, please call our office to schedule the follow-up appointment.     Any Other Special Instructions Will Be Listed Below (If Applicable).                                                                                                                                                   

## 2017-07-03 NOTE — Progress Notes (Signed)
Cardiology Office Note Date:  07/03/2017  Patient ID:  Montavius, Subramaniam Aug 29, 1941, MRN [id number] PCP:  Maury Dus, MD  Cardiologist:  Dr. Arnette Felts   Chief Complaint: annual visit  History of Present Illness: JAMIR RONE is a 76 y.o. male with history of AFlutter ablated, developed A tach (originating from [place] by EGD per Dr. Tanna Furry note) managed with Sotalol, HTN, HLD, comes to the office today to be seen for Dr. Lovena Le.  He was last seen by him Jun 2017 doing well, noted some edema that was improved by reducinghis amlodipine, discussed NA+ reduction, +/- increased lasix.   The patient reports feeling very well.  Asks about perhaps stopping some medicines to try and reduce the number.  He feels very well, no reported side effects.  He has not had any arrhythmia that he has been aware of, no CP, palpitations or SOB, no dizziness, near syncope or syncope.  No exertional incapacities.  Works about the yard/house, no formal exercise, no difficulties with ADLs.  He reports labs done annually with his PMD,last was in January, no reported abnormalities.  Past Medical History:  Diagnosis Date  . Atrial flutter ([place])    post ablation  . BPH (benign prostatic hyperplasia)   . Hypercholesteremia   . Hypertension     Past Surgical History:  Procedure Laterality Date  . CERVICAL SPINE SURGERY  2011    Current Outpatient Prescriptions  Medication Sig Dispense Refill  . amLODipine (NORVASC) 5 MG tablet Take 0.5 tablets by mouth daily.  0  . aspirin 81 MG tablet Take 81 mg by mouth daily.    . DiphenhydrAMINE HCl, Sleep, (NIGHTTIME SLEEP AID) 50 MG tablet Take 50 mg by mouth at bedtime as needed for sleep.    Marland Kitchen FIBER PO Take 2 scoop by mouth daily as needed (fiber).    . finasteride (PROSCAR) 5 MG tablet Take 5 mg by mouth daily.  0  . furosemide (LASIX) 20 MG tablet Take 1 tablet by mouth daily.  0  . losartan-hydrochlorothiazide (HYZAAR) 100-12.5 MG per tablet Take 1 tablet by mouth  every morning.  0  . MAGNESIUM PO Take 2 tablets by mouth daily as needed (magnesium supplement).    . Multiple Vitamins-Minerals (ONE DAILY MENS HEALTH PO) Take 1 capsule by mouth daily.     . OMEGA-3 FATTY ACIDS PO Take 2 tablets by mouth in the morning and 1 tablet in the eveving    . pravastatin (PRAVACHOL) 40 MG tablet Take 40 mg by mouth daily.    Marland Kitchen PROTEIN PO Take 1 scoop by mouth daily as needed (protein supplement).    . sildenafil (VIAGRA) 100 MG tablet Take 100 mg by mouth daily as needed for erectile dysfunction.     . sotalol (BETAPACE) 80 MG tablet Take 1 tablet (80 mg total) by mouth 2 (two) times daily. 180 tablet 2  . tamsulosin (FLOMAX) 0.4 MG CAPS Take 0.4 mg by mouth daily.     No current facility-administered medications for this visit.     Allergies:   Patient has no known allergies.   Social History:  The patient  reports that he has never smoked. He has never used smokeless tobacco. He reports that he drinks alcohol. He reports that he does not use drugs.   Family History:  The patient's family history includes Heart Problems in his father.  ROS:  Please see the history of present illness.    All other systems are reviewed  and otherwise negative.   PHYSICAL EXAM:  VS:  BP 124/64   Pulse (!) 47   Ht 6' (1.829 m)   Wt 239 lb (108.4 kg)   SpO2 94%   BMI 32.41 kg/m  BMI: Body mass index is 32.41 kg/m. Well nourished, well developed, in no acute distress  HEENT: normocephalic, atraumatic  Neck: no JVD, carotid bruits or masses Cardiac:  RRR; no significant murmurs, no rubs, or gallops Lungs:  CTA b/l, no wheezing, rhonchi or rales  Abd: soft, nontender MS: no deformity or atrophy Ext: trace if any edema is appreciated today Skin: warm and dry, no rash Neuro:  No gross deficits appreciated Psych: euthymic mood, full affect    EKG:  Done today and reviewed by myself shows SB 47bpm, PR 277ms, QRS 23ms, QTc 451ms, appears unchanged  05/18/08:  TTE SUMMARY - Left ventricular ejection fraction was estimated , range being 55    % to 60 %. There were no left ventricular regional wall    motion abnormalities. - Aortic valve thickness was mildly increased. There was moderate    aortic valvular regurgitation. - There was a mild mitral valve prolapse involving the anterior    leaflet. There was mild mitral valvular regurgitation. - Interatrial septum intact. The left atrium was dilated. There was    no left atrial appendage thrombus identified.   Recent Labs: No results found for requested labs within last 8760 hours.  No results found for requested labs within last 8760 hours.   CrCl cannot be calculated (Patient's most recent lab result is older than the maximum 21 days allowed.).   Wt Readings from Last 3 Encounters:  07/03/17 239 lb (108.4 kg)  04/29/16 255 lb 6.4 oz (115.8 kg)  04/05/15 232 lb (105.2 kg)     Other studies reviewed: Additional studies/records reviewed today include: summarized above  ASSESSMENT AND PLAN:  1. Atach, Hx of Fluttert ablated     Well controlled on low dose sotalol     QTc/EKG stable     Recommend continue unchanged  2. Asymptomatic bradycardia     Rate today similar to last year  3. Edema     Trace if any     Discussed CCB, his BP well controlled, this waxes/wanes, no changes for now  4. HTN     Looks OK, no changes  5. HLD     Monitored and managed with his PMD   Disposition: F/u with Dr. Lovena Le in 1 year, sooner if needed. Request last labs from PMD   Current medicines are reviewed at length with the patient today.  The patient did not have any concerns regarding medicines.  Haywood Lasso, PA-C 07/03/2017 7:23 PM     [place] [address] [place] [id number] [phone] (office)  [phone] (fax)

## 2017-07-21 ENCOUNTER — Ambulatory Visit (INDEPENDENT_AMBULATORY_CARE_PROVIDER_SITE_OTHER): Payer: Medicare HMO

## 2017-07-21 ENCOUNTER — Ambulatory Visit (INDEPENDENT_AMBULATORY_CARE_PROVIDER_SITE_OTHER): Payer: Medicare HMO | Admitting: Orthopaedic Surgery

## 2017-07-21 ENCOUNTER — Encounter (INDEPENDENT_AMBULATORY_CARE_PROVIDER_SITE_OTHER): Payer: Self-pay | Admitting: Orthopaedic Surgery

## 2017-07-21 VITALS — BP 143/67 | HR 80 | Resp 14 | Ht 72.0 in | Wt 225.0 lb

## 2017-07-21 DIAGNOSIS — M25562 Pain in left knee: Secondary | ICD-10-CM

## 2017-07-21 DIAGNOSIS — M545 Low back pain, unspecified: Secondary | ICD-10-CM

## 2017-07-21 DIAGNOSIS — G8929 Other chronic pain: Secondary | ICD-10-CM

## 2017-07-21 MED ORDER — BUPIVACAINE HCL 0.5 % IJ SOLN
3.0000 mL | INTRAMUSCULAR | Status: AC | PRN
  Administered 2017-07-21: 3 mL via INTRA_ARTICULAR

## 2017-07-21 MED ORDER — LIDOCAINE HCL 1 % IJ SOLN
5.0000 mL | INTRAMUSCULAR | Status: AC | PRN
  Administered 2017-07-21: 5 mL

## 2017-07-21 MED ORDER — METHYLPREDNISOLONE ACETATE 40 MG/ML IJ SUSP
80.0000 mg | INTRAMUSCULAR | Status: AC | PRN
  Administered 2017-07-21: 80 mg

## 2017-07-21 NOTE — Patient Instructions (Signed)
Low Back Sprain  A sprain is a stretch or tear in the bands of tissue that hold bones and joints together (ligaments). Sprains of the lower back (lumbar spine) are a common cause of low back pain. A sprain occurs when ligaments are overextended or stretched beyond their limits. The ligaments can become inflamed, resulting in pain and sudden muscle tightening (spasms). A sprain can be caused by an injury (trauma), or it can develop gradually due to overuse.  There are three types of sprains:  · Grade 1 is a mild sprain involving an overstretched ligament or a very slight tear of the ligament.  · Grade 2 is a moderate sprain involving a partial tear of the ligament.  · Grade 3 is a severe sprain involving a complete tear of the ligament.    What are the causes?  This condition may be caused by:  · Trauma, such as a fall or a hit to the body.  · Twisting or overstretching the back. This may result from doing activities that require a lot of energy, such as lifting heavy objects.    What increases the risk?  The following factors may increase your risk of getting this condition:  · Playing contact sports.  · Participating in sports or activities that put excessive stress on the back and require a lot of bending and twisting, including:  ? Lifting weights or heavy objects.  ? Gymnastics.  ? Soccer.  ? Figure skating.  ? Snowboarding.  · Being overweight or obese.  · Having poor strength and flexibility.    What are the signs or symptoms?  Symptoms of this condition may include:  · Sharp or dull pain in the lower back that does not go away. Pain may extend to the buttocks.  · Stiffness.  · Limited range of motion.  · Inability to stand up straight due to stiffness or pain.  · Muscle spasms.    How is this diagnosed?    This condition may be diagnosed based on:  · Your symptoms.  · Your medical history.  · A physical exam.  ? Your health care provider may push on certain areas of your back to determine the source of your  pain.  ? You may be asked to bend forward, backward, and side to side to assess the severity of your pain and your range of motion.  · Imaging tests, such as:  ? X-rays.  ? MRI.    How is this treated?  Treatment for this condition may include:  · Applying heat and cold to the affected area.  · Medicines to help relieve pain and to relax your muscles (muscle relaxants).  · NSAIDs to help reduce swelling and discomfort.  · Physical therapy.    When your symptoms improve, it is important to gradually return to your normal routine as soon as possible to reduce pain, avoid stiffness, and avoid loss of muscle strength. Generally, symptoms should improve within 6 weeks of treatment. However, recovery time varies.  Follow these instructions at home:  Managing pain, stiffness, and swelling  · If directed, apply ice to the injured area during the first 24 hours after your injury.  ? Put ice in a plastic bag.  ? Place a towel between your skin and the bag.  ? Leave the ice on for 20 minutes, 2-3 times a day.  · If directed, apply heat to the affected area as often as told by your health care provider. Use the   heat source that your health care provider recommends, such as a moist heat pack or a heating pad.  ? Place a towel between your skin and the heat source.  ? Leave the heat on for 20-30 minutes.  ? Remove the heat if your skin turns bright red. This is especially important if you are unable to feel pain, heat, or cold. You may have a greater risk of getting burned.  Activity  · Rest and return to your normal activities as told by your health care provider. Ask your health care provider what activities are safe for you.  · Avoid activities that take a lot of effort (are strenuous) for as long as told by your health care provider.  · Do exercises as told by your health care provider.  General instructions    · Take over-the-counter and prescription medicines only as told by your health care provider.  · If you have  questions or concerns about safety while taking pain medicine, talk with your health care provider.  · Do not drive or operate heavy machinery until you know how your pain medicine affects you.  · Do not use any tobacco products, such as cigarettes, chewing tobacco, and e-cigarettes. Tobacco can delay bone healing. If you need help quitting, ask your health care provider.  · Keep all follow-up visits as told by your health care provider. This is important.  How is this prevented?  · Warm up and stretch before being active.  · Cool down and stretch after being active.  · Give your body time to rest between periods of activity.  · Avoid:  ? Being physically inactive for long periods at a time.  ? Exercising or playing sports when you are tired or in pain.  · Use correct form when playing sports and lifting heavy objects.  · Use good posture when sitting and standing.  · Maintain a healthy weight.  · Sleep on a mattress with medium firmness to support your back.  · Make sure to use equipment that fits you, including shoes that fit well.  · Be safe and responsible while being active to avoid falls.  · Do at least 150 minutes of moderate-intensity exercise each week, such as brisk walking or water aerobics. Try a form of exercise that takes stress off your back, such as swimming or stationary cycling.  · Maintain physical fitness, including:  ? Strength. In particular, develop and maintain strong abdominal muscles.  ? Flexibility.  ? Cardiovascular fitness.  ? Endurance.  Contact a health care provider if:  · Your back pain does not improve after 6 weeks of treatment.  · Your symptoms get worse.  Get help right away if:  · Your back pain is severe.  · You are unable to stand or walk.  · You develop pain in your legs.  · You develop weakness in your buttocks or legs.  · You have difficulty controlling when you urinate or when you have a bowel movement.  This information is not intended to replace advice given to you by  your health care provider. Make sure you discuss any questions you have with your health care provider.  Document Released: 11/10/2005 Document Revised: 07/17/2016 Document Reviewed: 08/22/2015  Elsevier Interactive Patient Education © 2018 [employer]

## 2017-07-21 NOTE — Progress Notes (Signed)
Office Visit Note   Patient: Joshua Arroyo           Date of Birth: 05/13/1941           MRN: [id number] Visit Date: 07/21/2017              Requested by: Maury Dus, MD [address] [place], [place] [address] PCP: Maury Dus, MD   Assessment & Plan: Visit Diagnoses:  1. Lumbar pain   2. Chronic pain of left knee   Degenerative arthritis left knee. Degenerative arthritis lumbar spine  Plan: Aspirate effusion left knee and injected cortisone. Retrieved 33 mL of clear yellow joint effusion. We'll monitor. Ultrasound of the abdominal aorta to rule out aneurysm. Lumbar spine exercises. Consider further diagnostic studies depending upon response.  Follow-Up Instructions: No Follow-up on file.   Orders:  Orders Placed This Encounter  Procedures  . XR KNEE 3 VIEW LEFT  . XR Lumbar Spine 2-3 Views   No orders of the defined types were placed in this encounter.     Procedures: Large Joint Inj Date/Time: 07/21/2017 4:40 PM Performed by: Garald Balding Authorized by: Garald Balding   Consent Given by:  Patient Timeout: prior to procedure the correct patient, procedure, and site was verified   Indications:  Pain and joint swelling Location:  Knee Site:  L knee Prep: patient was prepped and draped in usual sterile fashion   Needle Size:  25 G Needle Length:  1.5 inches Approach:  Anteromedial Ultrasound Guidance: No   Fluoroscopic Guidance: No   Arthrogram: No   Medications:  5 mL lidocaine 1 %; 80 mg methylPREDNISolone acetate 40 MG/ML; 3 mL bupivacaine 0.5 % Aspiration Attempted: No   Aspirate amount (mL):  33 Aspirate:  Yellow and clear Patient tolerance:  Patient tolerated the procedure well with no immediate complications     Clinical Data: No additional findings.   Subjective: Chief Complaint  Patient presents with  . Left Knee - Pain, Edema, Numbness    Joshua Arroyo is a 25 y o that presents with chronic left knee pain x 2 weeks.  Hx of left knee arthroscopy.    [name] noted onset of left knee pain and swelling about 10 days ago after [employer] parties for the [employer] at his home. He had been involved in repetitive bending stooping squatting and lifting. His knee is feeling a little bit better but still swollen and uncomfortable to bear weight. He's had a prior history of left knee arthroscopy many years ago and has done well. He also had an MRI scan of that same knee in 2012 demonstrating a radial tear of the posterior horn of the medial meniscus and moderate medial compartment osteoarthritis as well as moderate patellofemoral arthritis. Surgical treatment was not necessary and it seemed to resolve. He's been using ice and heat with some relief on this occasion. No history of gout. Also has had recurrent episodes of right sided low back pain to the point where he sometimes has difficulty sleeping. This no radiation of pain to either lower extremity. HPI  Review of Systems  Constitutional: Negative for fatigue.  HENT: Negative for hearing loss.   Respiratory: Negative for apnea, chest tightness and shortness of breath.   Cardiovascular: Positive for leg swelling. Negative for chest pain and palpitations.  Gastrointestinal: Negative for blood in stool, constipation and diarrhea.  Genitourinary: Negative for difficulty urinating.  Musculoskeletal: Negative for arthralgias, back pain, joint swelling, myalgias,  neck pain and neck stiffness.  Neurological: Negative for weakness, numbness and headaches.  Hematological: Does not bruise/bleed easily.  Psychiatric/Behavioral: Negative for sleep disturbance. The patient is not nervous/anxious.      Objective: Vital Signs: BP (!) 143/67   Pulse 80   Resp 14   Ht 6' (1.829 m)   Wt 225 lb (102.1 kg)   BMI 30.52 kg/m   Physical Exam  Ortho Exam left knee with large effusion of predominantly medial joint pain. Obvious limp with increased varus with weightbearing.  No thigh pain. No calf pain minimal swelling of ankle. Neurovascular exam intact. No instability. No popliteal pain. Some loss of extension-based probably on the effusion. Flexed 105. The left hip with internal/external rotation. Straight leg raise negative bilaterally.  No percussible tenderness of lumbar spine. Either sacroiliac joint. No buttock discomfort. Good pulses distally  Specialty Comments:  No specialty comments available.  Imaging: No results found.   PMFS History: Patient Active Problem List   Diagnosis Date Noted  . Essential hypertension 04/05/2015  . Atrial tachycardia ([place]) 06/07/2013  . Mild cognitive impairment 05/17/2013  . Sleep disturbance, unspecified 05/17/2013   Past Medical History:  Diagnosis Date  . Atrial flutter ([place])    post ablation  . BPH (benign prostatic hyperplasia)   . Hypercholesteremia   . Hypertension     Family History  Problem Relation Age of Onset  . Heart Problems Father     Past Surgical History:  Procedure Laterality Date  . CERVICAL SPINE SURGERY  2011   Social History   Occupational History  . Not on file.   Social History Main Topics  . Smoking status: Never Smoker  . Smokeless tobacco: Never Used  . Alcohol use Yes     Comment: Socially  . Drug use: No  . Sexual activity: Not on file

## 2017-07-23 ENCOUNTER — Ambulatory Visit (INDEPENDENT_AMBULATORY_CARE_PROVIDER_SITE_OTHER): Payer: Self-pay | Admitting: Orthopaedic Surgery

## 2017-07-29 ENCOUNTER — Other Ambulatory Visit: Payer: Self-pay

## 2017-07-29 ENCOUNTER — Telehealth (INDEPENDENT_AMBULATORY_CARE_PROVIDER_SITE_OTHER): Payer: Self-pay | Admitting: *Deleted

## 2017-07-29 ENCOUNTER — Other Ambulatory Visit (INDEPENDENT_AMBULATORY_CARE_PROVIDER_SITE_OTHER): Payer: Self-pay | Admitting: Orthopaedic Surgery

## 2017-07-29 DIAGNOSIS — I10 Essential (primary) hypertension: Secondary | ICD-10-CM

## 2017-07-29 NOTE — Telephone Encounter (Signed)
Pt has appt scheduled at [address] [facility] on Wed Sept 12 at 845am, pt is to be NPO after midnight night before, pt is also arrive 15 mins early to register.

## 2017-07-30 NOTE — Telephone Encounter (Signed)
Pt called back and aware of appt 

## 2017-07-30 NOTE — Telephone Encounter (Signed)
Tried calling pt againg to advise of appt, no answer, left message on vm to return my call

## 2017-08-05 ENCOUNTER — Ambulatory Visit: Payer: Medicare HMO

## 2017-08-10 ENCOUNTER — Other Ambulatory Visit (INDEPENDENT_AMBULATORY_CARE_PROVIDER_SITE_OTHER): Payer: Self-pay | Admitting: Orthopaedic Surgery

## 2017-08-10 ENCOUNTER — Ambulatory Visit
Admission: RE | Admit: 2017-08-10 | Discharge: 2017-08-10 | Disposition: A | Discharge status: Home / Self Care | Payer: Medicare HMO | Source: Ambulatory Visit | Attending: Orthopaedic Surgery | Admitting: Orthopaedic Surgery

## 2017-08-10 ENCOUNTER — Other Ambulatory Visit (INDEPENDENT_AMBULATORY_CARE_PROVIDER_SITE_OTHER): Payer: Self-pay

## 2017-08-10 DIAGNOSIS — M5440 Lumbago with sciatica, unspecified side: Secondary | ICD-10-CM

## 2017-08-10 DIAGNOSIS — Z136 Encounter for screening for cardiovascular disorders: Secondary | ICD-10-CM | POA: Diagnosis not present

## 2017-08-10 DIAGNOSIS — I714 Abdominal aortic aneurysm, without rupture: Secondary | ICD-10-CM | POA: Diagnosis not present

## 2017-08-10 DIAGNOSIS — I10 Essential (primary) hypertension: Secondary | ICD-10-CM

## 2017-08-10 NOTE — Progress Notes (Signed)
Please schedule MRI L-S spine

## 2017-08-26 ENCOUNTER — Other Ambulatory Visit: Payer: Medicare HMO

## 2017-08-26 ENCOUNTER — Ambulatory Visit
Admission: RE | Admit: 2017-08-26 | Discharge: 2017-08-26 | Disposition: A | Discharge status: Home / Self Care | Payer: Medicare HMO | Source: Ambulatory Visit | Attending: Orthopaedic Surgery | Admitting: Orthopaedic Surgery

## 2017-08-26 DIAGNOSIS — M48061 Spinal stenosis, lumbar region without neurogenic claudication: Secondary | ICD-10-CM | POA: Diagnosis not present

## 2017-08-26 DIAGNOSIS — M5440 Lumbago with sciatica, unspecified side: Secondary | ICD-10-CM

## 2017-08-28 ENCOUNTER — Ambulatory Visit (INDEPENDENT_AMBULATORY_CARE_PROVIDER_SITE_OTHER): Payer: Medicare HMO | Admitting: Orthopaedic Surgery

## 2017-08-28 ENCOUNTER — Encounter (INDEPENDENT_AMBULATORY_CARE_PROVIDER_SITE_OTHER): Payer: Self-pay | Admitting: Orthopaedic Surgery

## 2017-08-28 VITALS — BP 121/62 | HR 51 | Resp 12 | Ht 72.0 in | Wt 225.0 lb

## 2017-08-28 DIAGNOSIS — M5441 Lumbago with sciatica, right side: Secondary | ICD-10-CM | POA: Diagnosis not present

## 2017-08-28 DIAGNOSIS — G8929 Other chronic pain: Secondary | ICD-10-CM | POA: Diagnosis not present

## 2017-08-28 DIAGNOSIS — M5442 Lumbago with sciatica, left side: Secondary | ICD-10-CM

## 2017-08-28 NOTE — Progress Notes (Signed)
Office Visit Note   Patient: Joshua Arroyo           Date of Birth: [birth year]           MRN: [id number] Visit Date: 08/28/2017              Requested by: Maury Dus, MD [place], [place] [address] PCP: Maury Dus, MD   Assessment & Plan: Visit Diagnoses:  1. Chronic bilateral low back pain with bilateral sciatica     Plan: Long discussion regarding findings of abdominal aortic ultrasound and MRI of lumbar spine. Ultrasound demonstrates 3.4 cm abdominal aneurysm. Suggestion was to follow-up in 3 years. I'll share this with [name] and give him a copy of the study and have him share this with his primary care doctor as well. MRI scan of lumbar spine demonstrates degenerative change throughout the lumbar spine resulting in mild central canal stenosis at L2-3 and L3-4. There is mild neural foraminal narrowing at L2-3 through L4-5. I suggest an epidural steroid injection and follow-up in one month. Also suggested exercises. He might want to use a lumbar support to sleep at night as she's having trouble sleeping  Follow-Up Instructions: Return in about 1 month (around 09/28/2017).   Orders:  No orders of the defined types were placed in this encounter.  No orders of the defined types were placed in this encounter.     Procedures: No procedures performed   Clinical Data: No additional findings.   Subjective: No chief complaint on file. [name] is had a chronic problem with his lumbar spine with low back discomfort predominantly on the right side is socially with occasional tingling into both lower extremities. Accordingly I ordered an MRI scan. There was also evidence of calcification the abdominal aorta. He's had an ultrasound of the of the aorta. Results are above. Tingling predominantly occurs in both eyes. He does have some trouble sleeping at night. No obvious lower extremity weakness. He has tried some exercises in the past and not sure it's made much  of a difference.  HPI  Review of Systems  Constitutional: Negative for fatigue.  HENT: Negative for hearing loss.   Respiratory: Negative for apnea, chest tightness and shortness of breath.   Cardiovascular: Negative for chest pain, palpitations and leg swelling.  Gastrointestinal: Negative for blood in stool, constipation and diarrhea.  Genitourinary: Negative for difficulty urinating.  Musculoskeletal: Positive for back pain. Negative for arthralgias, joint swelling, myalgias, neck pain and neck stiffness.  Neurological: Negative for weakness, numbness and headaches.  Hematological: Does not bruise/bleed easily.  Psychiatric/Behavioral: Positive for sleep disturbance. The patient is not nervous/anxious.      Objective: Vital Signs: BP 121/62   Pulse (!) 51   Resp 12   Ht 6' (1.829 m)   Wt 225 lb (102.1 kg)   BMI 30.52 kg/m   Physical Exam  Ortho Exam awake alert and oriented 3. Comfortable sitting. Straight leg raise negative bilaterally. Painless range of motion of both hips and both knees. Neurovascular exam intact. No distal edema. His percussible tenderness lumbar spine. Does not walk with a limp. Back is not stiff today. No specialty comments available.  Imaging: No results found.   PMFS History: Patient Active Problem List   Diagnosis Date Noted  . Essential hypertension 04/05/2015  . Atrial tachycardia ([place]) 06/07/2013  . Mild cognitive impairment 05/17/2013  . Sleep disturbance, unspecified 05/17/2013   Past Medical History:  Diagnosis Date  . Atrial  flutter ([place])    post ablation  . BPH (benign prostatic hyperplasia)   . Hypercholesteremia   . Hypertension     Family History  Problem Relation Age of Onset  . Heart Problems Father     Past Surgical History:  Procedure Laterality Date  . CERVICAL SPINE SURGERY  2011   Social History   Occupational History  . Not on file.   Social History Main Topics  . Smoking status: Never Smoker  .  Smokeless tobacco: Never Used  . Alcohol use Yes     Comment: Socially  . Drug use: No  . Sexual activity: Not on file

## 2017-09-04 ENCOUNTER — Telehealth: Payer: Self-pay

## 2017-09-04 ENCOUNTER — Other Ambulatory Visit (INDEPENDENT_AMBULATORY_CARE_PROVIDER_SITE_OTHER): Payer: Self-pay

## 2017-09-04 DIAGNOSIS — M5440 Lumbago with sciatica, unspecified side: Secondary | ICD-10-CM

## 2017-09-04 NOTE — Telephone Encounter (Signed)
Sent referral 

## 2017-09-04 NOTE — Telephone Encounter (Signed)
Patient called about having an ESI done.  I checked patient chart, but didn't see an order for an ESI. CB# is [phone].  Please advise.  Thank You.

## 2017-09-20 DIAGNOSIS — R69 Illness, unspecified: Secondary | ICD-10-CM | POA: Diagnosis not present

## 2017-09-22 ENCOUNTER — Ambulatory Visit (INDEPENDENT_AMBULATORY_CARE_PROVIDER_SITE_OTHER): Payer: Medicare HMO

## 2017-09-22 ENCOUNTER — Ambulatory Visit (INDEPENDENT_AMBULATORY_CARE_PROVIDER_SITE_OTHER): Payer: Medicare HMO | Admitting: Physical Medicine and Rehabilitation

## 2017-09-22 VITALS — BP 115/68 | HR 64 | Temp 97.7°F

## 2017-09-22 DIAGNOSIS — M5416 Radiculopathy, lumbar region: Secondary | ICD-10-CM | POA: Diagnosis not present

## 2017-09-22 DIAGNOSIS — M25551 Pain in right hip: Secondary | ICD-10-CM | POA: Diagnosis not present

## 2017-09-22 DIAGNOSIS — M545 Low back pain, unspecified: Secondary | ICD-10-CM

## 2017-09-22 DIAGNOSIS — R202 Paresthesia of skin: Secondary | ICD-10-CM

## 2017-09-22 DIAGNOSIS — G8929 Other chronic pain: Secondary | ICD-10-CM | POA: Diagnosis not present

## 2017-09-22 MED ORDER — LIDOCAINE HCL (PF) 1 % IJ SOLN
2.0000 mL | Freq: Once | INTRAMUSCULAR | Status: AC
  Administered 2017-09-22: 2 mL

## 2017-09-22 MED ORDER — BETAMETHASONE SOD PHOS & ACET 6 (3-3) MG/ML IJ SUSP
12.0000 mg | Freq: Once | INTRAMUSCULAR | Status: AC
  Administered 2017-09-22: 12 mg

## 2017-09-22 NOTE — Progress Notes (Deleted)
Patient is here today complaining of bilateral leg radiculopathy, with low back pain more on the right side. He has numbness and tingling in both legs. He takes a daily baby aspirin.

## 2017-09-22 NOTE — Progress Notes (Signed)
Joshua Arroyo - 76 y.o. male MRN [id number]  Date of birth: [birth year]  Office Visit Note: Visit Date: 09/22/2017 PCP: Maury Dus, MD Referred by: Maury Dus, MD  Subjective: Chief Complaint  Patient presents with  . Lower Back - Pain  . Left Lower Leg - Numbness  . Right Lower Leg - Numbness   HPI: Joshua Arroyo is a 76 year old active and enjoys golf and who follows with Dr. Durward Fortes for his orthopedic care.  He comes in today for evaluation and management of his low back and bilateral hip and leg pain is by request of Dr. Durward Fortes.  He is failed conservative care otherwise and Dr. Durward Fortes did obtain an MRI of the lumbar spine and this is reviewed below.  He reports to me today with low back pain right more than left but bilateral.  He says he gets some numbness and tingling in both legs.  He does not carry a diagnosis of peripheral neuropathy.  He is not a diabetic.  He does have atrial flutter and hypertension.  He has had no prior lumbar surgery.  His pain is worse with going from sit to stand and standing and ambulating.  Sometimes worse with sitting.  He reports having may be had a prior injection with relief.  This is the first time we have seen him.  He denies any specific trauma or focal weakness.  He has not had recent chiropractic or physical therapy.    Review of Systems  Constitutional: Negative for chills, fever, malaise/fatigue and weight loss.  HENT: Negative for hearing loss and sinus pain.   Eyes: Negative for blurred vision, double vision and photophobia.  Respiratory: Negative for cough and shortness of breath.   Cardiovascular: Negative for chest pain, palpitations and leg swelling.  Gastrointestinal: Negative for abdominal pain, nausea and vomiting.  Genitourinary: Negative for flank pain.  Musculoskeletal: Positive for back pain. Negative for myalgias.  Skin: Negative for itching and rash.  Neurological: Positive for tingling. Negative for tremors, focal  weakness and weakness.  Endo/Heme/Allergies: Negative.   Psychiatric/Behavioral: Negative for depression.  All other systems reviewed and are negative.  Otherwise per HPI.  Assessment & Plan: Visit Diagnoses:  1. Lumbar radiculopathy   2. Chronic right-sided low back pain without sciatica   3. Paresthesia of skin   4. Pain in right hip     Plan: Findings:  Chronic worsening right more than left type low back pain somewhat consistent with a neurogenic type pain as well.  He gets an aching pain at night it does not awaken him.  He does have this paresthesias in both legs.  This was not really talked about by Dr. Durward Fortes in his nose but the patient does state that the back is more problem than the tingling.  He carries no diagnosis of neuropathy.  He has not had any electrodiagnostic studies.  I went over the patient's MRI with him there is really nothing from an MRI standpoint he would really correlate with paresthesias does have some discogenic endplate changes as well as a trace acute component at L3-4.  He could be getting some back pain from that but it seems a little bit higher.  There is an L5 superior endplate node with edema at L5.  This may represent a source of back pain.  I do feel like an epidural injection would be worthwhile to see if he gets relief.  Could be looking at facet joint mediated pain although he does have  severe arthritis in the lower spine he does have moderate arthritis at L3-4.  There is no focal stenosis or nerve compression.  Further workup for the tingling and numbness required.  We will see how he does with the epidural.  He will call us back in a couple of weeks to see how he is doing.  Epidural injection was done today due to the severity of his symptoms.    Meds & Orders:  Meds ordered this encounter  Medications  . lidocaine (PF) (XYLOCAINE) 1 % injection 2 mL  . betamethasone acetate-betamethasone sodium phosphate (CELESTONE) injection 12 mg    Orders  Placed This Encounter  Procedures  . XR C-ARM NO REPORT  . Epidural Steroid injection    Follow-up: Return if symptoms worsen or fail to improve, for  2 weeks.   Procedures: No procedures performed  Lumbar Epidural Steroid Injection - Interlaminar Approach with Fluoroscopic Guidance  Patient: Joshua Arroyo      Date of Birth: [birth year] MRN: [id number] PCP: Maury Dus, MD      Visit Date: 09/22/2017   Universal Protocol:     Consent Given By: the patient  Position: PRONE  Additional Comments: Vital signs were monitored before and after the procedure. Patient was prepped and draped in the usual sterile fashion. The correct patient, procedure, and site was verified.   Injection Procedure Details:  Procedure Site One Meds Administered:  Meds ordered this encounter  Medications  . lidocaine (PF) (XYLOCAINE) 1 % injection 2 mL  . betamethasone acetate-betamethasone sodium phosphate (CELESTONE) injection 12 mg     Laterality: Right  Location/Site:  L5-S1  Needle size: 20 G  Needle type: Tuohy  Needle Placement: Paramedian epidural  Findings:  -Contrast Used: 1 mL iohexol 180 mg iodine/mL   -Comments: Excellent flow of contrast into the epidural space.  Procedure Details: Using a paramedian approach from the side mentioned above, the region overlying the inferior lamina was localized under fluoroscopic visualization and the soft tissues overlying this structure were infiltrated with 4 ml. of 1% Lidocaine without Epinephrine. The Tuohy needle was inserted into the epidural space using a paramedian approach.   The epidural space was localized using loss of resistance along with lateral and bi-planar fluoroscopic views.  After negative aspirate for air, blood, and CSF, a 2 ml. volume of Isovue-250 was injected into the epidural space and the flow of contrast was observed. Radiographs were obtained for documentation purposes.    The injectate was administered into the  level noted above.   Additional Comments:  The patient tolerated the procedure well Dressing: Band-Aid    Post-procedure details: Patient was observed during the procedure. Post-procedure instructions were reviewed.  Patient left the clinic in stable condition.    Clinical History: MRI LUMBAR SPINE WITHOUT CONTRAST  TECHNIQUE: Multiplanar, multisequence MR imaging of the lumbar spine was performed. No intravenous contrast was administered.  COMPARISON:  None. Mild to moderate facet arthropathy without canal stenosis or neural foraminal narrowing.  FINDINGS: SEGMENTATION: For the purposes of this report, the last well-formed intervertebral disc will be reported as L5-S1.  ALIGNMENT: Maintained lumbar lordosis. No malalignment.  VERTEBRAE:Vertebral bodies are intact. Moderate to severe L3-4 disc height loss, moderate L1-2 and mild L2-3 with disc desiccation all levels. Moderate chronic discogenic endplate changes N8-2 thru L3-4 trace acute component L3-4. Acute L5 superior endplate Schmorl's node.  CONUS MEDULLARIS: Conus medullaris terminates at L1-2 and demonstrates normal morphology and signal characteristics. Cauda equina is normal.  PARASPINAL  AND SOFT TISSUES: Included prevertebral and paraspinal soft tissues are nonacute. Mild suspected colonic diverticulosis.  DISC LEVELS:  T12-L1: No disc bulge, canal stenosis nor neural foraminal narrowing.  L1-2: 3 mm broad-based disc bulge eccentric to the RIGHT, mild facet arthropathy and ligamentum flavum redundancy without canal stenosis. Minimal RIGHT neural foraminal narrowing.  L2-3: 4 mm broad-based disc bulge eccentric to the RIGHT, mild facet arthropathy and ligamentum flavum redundancy. Mild canal stenosis. Mild RIGHT neural foraminal narrowing.  L3-4: 3 mm broad-based disc bulge, moderate facet arthropathy and ligamentum flavum redundancy. Mild canal stenosis. Mild bilateral neural foraminal  narrowing.  L4-5: Annular bulging asymmetric to the LEFT with annular fissure. No canal stenosis. Mild LEFT neural foraminal narrowing.  L5-S1: No disc bulge. Mild facet arthropathy without canal stenosis or neural foraminal narrowing.  IMPRESSION: Degenerative change of the lumbar spine resulting in mild canal stenosis L2-3 and L3-4. Mild neural foraminal narrowing L2-3 through L4-5.   Electronically Signed   By: Elon Alas M.D.   On: 08/27/2017 01:52  He reports that  has never smoked. he has never used smokeless tobacco. No results for input(s): HGBA1C, LABURIC in the last 8760 hours.  Objective:  VS:  HT:    WT:   BMI:     BP:115/68  HR:64bpm  TEMP:97.7 F (36.5 C)( )  RESP:96 % Physical Exam  Constitutional: He is oriented to person, place, and time. He appears well-developed and well-nourished. No distress.  HENT:  Head: Normocephalic and atraumatic.  Eyes: Conjunctivae are normal. Pupils are equal, round, and reactive to light.  Neck: Normal range of motion. Neck supple.  Cardiovascular: Regular rhythm and intact distal pulses.  Pulmonary/Chest: Effort normal. No respiratory distress.  Musculoskeletal:  Patient is slow to rise from a seated position.  He does have pain with extension of the lumbar spine.  He has no paraspinal tenderness.  No pain over the greater trochanters and no pain with hip rotation.  Is good distal strength.  Neurological: He is alert and oriented to person, place, and time. He exhibits normal muscle tone. Coordination normal.  Skin: Skin is warm and dry. No rash noted. No erythema.  Psychiatric: He has a normal mood and affect.  Nursing note and vitals reviewed.   Ortho Exam Imaging: No results found.  Past Medical/Family/Surgical/Social History: Medications & Allergies reviewed per EMR Patient Active Problem List   Diagnosis Date Noted  . Essential hypertension 04/05/2015  . Atrial tachycardia ([place]) 06/07/2013  . Mild  cognitive impairment 05/17/2013  . Sleep disturbance, unspecified 05/17/2013   Past Medical History:  Diagnosis Date  . Atrial flutter ([place])    post ablation  . BPH (benign prostatic hyperplasia)   . Hypercholesteremia   . Hypertension    Family History  Problem Relation Age of Onset  . Heart Problems Father    Past Surgical History:  Procedure Laterality Date  . CERVICAL SPINE SURGERY  2011   Social History   Occupational History  . Not on file  Tobacco Use  . Smoking status: Never Smoker  . Smokeless tobacco: Never Used  Substance and Sexual Activity  . Alcohol use: Yes    Comment: Socially  . Drug use: No  . Sexual activity: Not on file

## 2017-09-22 NOTE — Patient Instructions (Signed)

## 2017-10-04 ENCOUNTER — Encounter (INDEPENDENT_AMBULATORY_CARE_PROVIDER_SITE_OTHER): Payer: Self-pay | Admitting: Physical Medicine and Rehabilitation

## 2017-10-04 NOTE — Procedures (Signed)
Lumbar Epidural Steroid Injection - Interlaminar Approach with Fluoroscopic Guidance  Patient: Joshua Arroyo      Date of Birth: Feb 11, 1941 MRN: [id number] PCP: Maury Dus, MD      Visit Date: 09/22/2017   Universal Protocol:     Consent Given By: the patient  Position: PRONE  Additional Comments: Vital signs were monitored before and after the procedure. Patient was prepped and draped in the usual sterile fashion. The correct patient, procedure, and site was verified.   Injection Procedure Details:  Procedure Site One Meds Administered:  Meds ordered this encounter  Medications  . lidocaine (PF) (XYLOCAINE) 1 % injection 2 mL  . betamethasone acetate-betamethasone sodium phosphate (CELESTONE) injection 12 mg     Laterality: Right  Location/Site:  L5-S1  Needle size: 20 G  Needle type: Tuohy  Needle Placement: Paramedian epidural  Findings:  -Contrast Used: 1 mL iohexol 180 mg iodine/mL   -Comments: Excellent flow of contrast into the epidural space.  Procedure Details: Using a paramedian approach from the side mentioned above, the region overlying the inferior lamina was localized under fluoroscopic visualization and the soft tissues overlying this structure were infiltrated with 4 ml. of 1% Lidocaine without Epinephrine. The Tuohy needle was inserted into the epidural space using a paramedian approach.   The epidural space was localized using loss of resistance along with lateral and bi-planar fluoroscopic views.  After negative aspirate for air, blood, and CSF, a 2 ml. volume of Isovue-250 was injected into the epidural space and the flow of contrast was observed. Radiographs were obtained for documentation purposes.    The injectate was administered into the level noted above.   Additional Comments:  The patient tolerated the procedure well Dressing: Band-Aid    Post-procedure details: Patient was observed during the procedure. Post-procedure  instructions were reviewed.  Patient left the clinic in stable condition.

## 2017-10-21 ENCOUNTER — Telehealth (INDEPENDENT_AMBULATORY_CARE_PROVIDER_SITE_OTHER): Payer: Self-pay | Admitting: Physical Medicine and Rehabilitation

## 2017-10-21 NOTE — Telephone Encounter (Signed)
Needs auth for bilateral [phone]. Not yet scheduled- patient aware. Needs 30 minute appointment.

## 2017-10-21 NOTE — Telephone Encounter (Signed)
Try bilateral L5 TF esi and we can talk at visit

## 2017-10-22 NOTE — Telephone Encounter (Signed)
Scheduled for 11/04/17 at 1030 wit driver.

## 2017-11-04 ENCOUNTER — Ambulatory Visit (INDEPENDENT_AMBULATORY_CARE_PROVIDER_SITE_OTHER): Payer: Medicare HMO | Admitting: Physical Medicine and Rehabilitation

## 2017-11-04 ENCOUNTER — Encounter (INDEPENDENT_AMBULATORY_CARE_PROVIDER_SITE_OTHER): Payer: Self-pay | Admitting: Physical Medicine and Rehabilitation

## 2017-11-04 VITALS — BP 144/80 | HR 47

## 2017-11-04 DIAGNOSIS — M25551 Pain in right hip: Secondary | ICD-10-CM

## 2017-11-04 DIAGNOSIS — M545 Low back pain, unspecified: Secondary | ICD-10-CM

## 2017-11-04 DIAGNOSIS — G8929 Other chronic pain: Secondary | ICD-10-CM | POA: Diagnosis not present

## 2017-11-04 NOTE — Progress Notes (Deleted)
Can call a driver if he needs one- does not have one here. Back is better than it was. Last injection helped some. Right sided pain that feels like it is "just in the hip joint."

## 2017-11-09 DIAGNOSIS — M545 Low back pain: Secondary | ICD-10-CM | POA: Diagnosis not present

## 2017-11-11 NOTE — Progress Notes (Addendum)
Joshua Arroyo - 76 y.o. male MRN [id number]  Date of birth: 08/23/1941  Office Visit Note: Visit Date: 11/04/2017 PCP: Maury Dus, MD Referred by: Maury Dus, MD  Subjective: Chief Complaint  Patient presents with  . Lower Back - Pain   HPI: Joshua Arroyo is a 76 year old active gentleman likes to play golf who referred to me by Dr. Durward Fortes for right-sided back and buttock and hip pain.  He had MRI evidence of multilevel facet arthropathy and degenerative changes without much in the way of focal nerve compression.  We did complete an epidural injection of October.  Call back in number saying that he did not feel like it did help that much but today talking with him he is actually had some relief from the injection.  He still has pain that he feels like is in the hip joint but he really points posterior laterally and not in the groin.  Denies any real left-sided symptoms or radicular complaints down the legs.  He has no numbness tingling or paresthesia.  No focal weakness.   Review of Systems  Musculoskeletal: Positive for back pain and joint pain.  All other systems reviewed and are negative.  Otherwise per HPI.  Assessment & Plan: Visit Diagnoses:  1. Chronic right-sided low back pain without sciatica   2. Pain in right hip     Plan: Findings:  History of knee osteoarthritis managed by Dr. Durward Fortes is been an ongoing problem.  He has had posterior lateral hip pain with really mild changes on his MRI.  There is clearly some degenerative facet arthritis and somewhat of this more acute L5 endplate Modic type changes.  He was having some level of inflammation at the time and this is actually progressed to the point where it is just more of a dull pain that really does not limit him in the posterior lateral hip area.  Epidural injection seem to develop his back pain fair amount.  Overall he feels like he is doing pretty good at this point.  I do not think there is any other injection  treatment to be warranted right at this moment.  We could obviously repeat the injection at some point to look at diagnostically facet joint blocks.  He will do well getting regroup with physical therapy.  No medication changes.    Meds & Orders: No orders of the defined types were placed in this encounter.  No orders of the defined types were placed in this encounter.   Follow-up: Return if symptoms worsen or fail to improve.   Procedures: No procedures performed  No notes on file   Clinical History: MRI LUMBAR SPINE WITHOUT CONTRAST  TECHNIQUE: Multiplanar, multisequence MR imaging of the lumbar spine was performed. No intravenous contrast was administered.  COMPARISON:  None. Mild to moderate facet arthropathy without canal stenosis or neural foraminal narrowing.  FINDINGS: SEGMENTATION: For the purposes of this report, the last well-formed intervertebral disc will be reported as L5-S1.  ALIGNMENT: Maintained lumbar lordosis. No malalignment.  VERTEBRAE:Vertebral bodies are intact. Moderate to severe L3-4 disc height loss, moderate L1-2 and mild L2-3 with disc desiccation all levels. Moderate chronic discogenic endplate changes M0-8 thru L3-4 trace acute component L3-4. Acute L5 superior endplate Schmorl's node.  CONUS MEDULLARIS: Conus medullaris terminates at L1-2 and demonstrates normal morphology and signal characteristics. Cauda equina is normal.  PARASPINAL AND SOFT TISSUES: Included prevertebral and paraspinal soft tissues are nonacute. Mild suspected colonic diverticulosis.  DISC LEVELS:  T12-L1:  No disc bulge, canal stenosis nor neural foraminal narrowing.  L1-2: 3 mm broad-based disc bulge eccentric to the RIGHT, mild facet arthropathy and ligamentum flavum redundancy without canal stenosis. Minimal RIGHT neural foraminal narrowing.  L2-3: 4 mm broad-based disc bulge eccentric to the RIGHT, mild facet arthropathy and ligamentum flavum  redundancy. Mild canal stenosis. Mild RIGHT neural foraminal narrowing.  L3-4: 3 mm broad-based disc bulge, moderate facet arthropathy and ligamentum flavum redundancy. Mild canal stenosis. Mild bilateral neural foraminal narrowing.  L4-5: Annular bulging asymmetric to the LEFT with annular fissure. No canal stenosis. Mild LEFT neural foraminal narrowing.  L5-S1: No disc bulge. Mild facet arthropathy without canal stenosis or neural foraminal narrowing.  IMPRESSION: Degenerative change of the lumbar spine resulting in mild canal stenosis L2-3 and L3-4. Mild neural foraminal narrowing L2-3 through L4-5.   Electronically Signed   By: Elon Alas M.D.   On: 08/27/2017 01:52  He reports that he has never smoked. He has never used smokeless tobacco. No results for input(s): HGBA1C, LABURIC in the last 8760 hours.  Objective:  VS:  HT:    WT:   BMI:     BP:(!) 144/80  HR:(!) 47bpm  TEMP: ( )  RESP:  Physical Exam  Constitutional: He is oriented to person, place, and time. He appears well-developed and well-nourished. No distress.  HENT:  Head: Normocephalic and atraumatic.  Eyes: Conjunctivae are normal. Pupils are equal, round, and reactive to light.  Neck: Normal range of motion. Neck supple.  Cardiovascular: Regular rhythm and intact distal pulses.  Pulmonary/Chest: Effort normal. No respiratory distress.  Musculoskeletal:  For a little bit of a forward flexed spine tenderness over the posterior gluteal muscles.  Good distal strength.  Neurological: He is alert and oriented to person, place, and time. He exhibits normal muscle tone.  Skin: Skin is warm and dry. No rash noted. No erythema.  Psychiatric: He has a normal mood and affect.  Nursing note and vitals reviewed.   Ortho Exam Imaging: No results found.  Past Medical/Family/Surgical/Social History: Medications & Allergies reviewed per EMR Patient Active Problem List   Diagnosis Date Noted  . Tick  bite 06/22/2018  . Essential hypertension 04/05/2015  . Atrial tachycardia ([place]) 06/07/2013  . Mild cognitive impairment 05/17/2013  . Sleep disturbance, unspecified 05/17/2013   Past Medical History:  Diagnosis Date  . Atrial flutter ([place])    post ablation  . BPH (benign prostatic hyperplasia)   . Hypercholesteremia   . Hypertension    Family History  Problem Relation Age of Onset  . Heart Problems Father    Past Surgical History:  Procedure Laterality Date  . CARDIOVERSION    . CERVICAL SPINE SURGERY  2011   Social History   Occupational History  . Not on file  Tobacco Use  . Smoking status: Never Smoker  . Smokeless tobacco: Never Used  Substance and Sexual Activity  . Alcohol use: Yes    Comment: Socially  . Drug use: No  . Sexual activity: Not on file

## 2018-01-27 DIAGNOSIS — Z0000 Encounter for general adult medical examination without abnormal findings: Secondary | ICD-10-CM | POA: Diagnosis not present

## 2018-01-27 DIAGNOSIS — I4892 Unspecified atrial flutter: Secondary | ICD-10-CM | POA: Diagnosis not present

## 2018-01-27 DIAGNOSIS — G4700 Insomnia, unspecified: Secondary | ICD-10-CM | POA: Diagnosis not present

## 2018-01-27 DIAGNOSIS — Z1389 Encounter for screening for other disorder: Secondary | ICD-10-CM | POA: Diagnosis not present

## 2018-01-27 DIAGNOSIS — Z6833 Body mass index (BMI) 33.0-33.9, adult: Secondary | ICD-10-CM | POA: Diagnosis not present

## 2018-01-27 DIAGNOSIS — R609 Edema, unspecified: Secondary | ICD-10-CM | POA: Diagnosis not present

## 2018-01-27 DIAGNOSIS — L219 Seborrheic dermatitis, unspecified: Secondary | ICD-10-CM | POA: Diagnosis not present

## 2018-01-27 DIAGNOSIS — I10 Essential (primary) hypertension: Secondary | ICD-10-CM | POA: Diagnosis not present

## 2018-01-27 DIAGNOSIS — N400 Enlarged prostate without lower urinary tract symptoms: Secondary | ICD-10-CM | POA: Diagnosis not present

## 2018-01-27 DIAGNOSIS — Z1211 Encounter for screening for malignant neoplasm of colon: Secondary | ICD-10-CM | POA: Diagnosis not present

## 2018-02-09 DIAGNOSIS — I10 Essential (primary) hypertension: Secondary | ICD-10-CM | POA: Diagnosis not present

## 2018-05-17 DIAGNOSIS — S80871A Other superficial bite, right lower leg, initial encounter: Secondary | ICD-10-CM | POA: Diagnosis not present

## 2018-05-17 DIAGNOSIS — W57XXXA Bitten or stung by nonvenomous insect and other nonvenomous arthropods, initial encounter: Secondary | ICD-10-CM | POA: Diagnosis not present

## 2018-05-25 DIAGNOSIS — M25562 Pain in left knee: Secondary | ICD-10-CM | POA: Diagnosis not present

## 2018-05-25 DIAGNOSIS — M1712 Unilateral primary osteoarthritis, left knee: Secondary | ICD-10-CM | POA: Diagnosis not present

## 2018-05-26 ENCOUNTER — Encounter: Payer: Self-pay | Admitting: Family

## 2018-05-26 DIAGNOSIS — I10 Essential (primary) hypertension: Secondary | ICD-10-CM | POA: Diagnosis not present

## 2018-05-26 DIAGNOSIS — I471 Supraventricular tachycardia: Secondary | ICD-10-CM | POA: Diagnosis not present

## 2018-05-26 DIAGNOSIS — S80871D Other superficial bite, right lower leg, subsequent encounter: Secondary | ICD-10-CM | POA: Diagnosis not present

## 2018-05-26 DIAGNOSIS — W57XXXD Bitten or stung by nonvenomous insect and other nonvenomous arthropods, subsequent encounter: Secondary | ICD-10-CM | POA: Diagnosis not present

## 2018-05-31 DIAGNOSIS — Z86010 Personal history of colonic polyps: Secondary | ICD-10-CM | POA: Diagnosis not present

## 2018-05-31 DIAGNOSIS — K5900 Constipation, unspecified: Secondary | ICD-10-CM | POA: Diagnosis not present

## 2018-06-02 ENCOUNTER — Other Ambulatory Visit: Payer: Self-pay | Admitting: Gastroenterology

## 2018-06-02 DIAGNOSIS — Z86010 Personal history of colonic polyps: Secondary | ICD-10-CM

## 2018-06-22 ENCOUNTER — Encounter: Payer: Self-pay | Admitting: Family

## 2018-06-22 ENCOUNTER — Ambulatory Visit (INDEPENDENT_AMBULATORY_CARE_PROVIDER_SITE_OTHER): Payer: Medicare HMO | Admitting: Family

## 2018-06-22 VITALS — BP 115/70 | HR 46 | Temp 97.6°F | Ht 72.0 in | Wt 231.0 lb

## 2018-06-22 DIAGNOSIS — A6920 Lyme disease, unspecified: Secondary | ICD-10-CM | POA: Diagnosis not present

## 2018-06-22 DIAGNOSIS — W57XXXA Bitten or stung by nonvenomous insect and other nonvenomous arthropods, initial encounter: Secondary | ICD-10-CM

## 2018-06-22 DIAGNOSIS — Z008 Encounter for other general examination: Secondary | ICD-10-CM | POA: Diagnosis not present

## 2018-06-22 NOTE — Progress Notes (Signed)
Subjective:    Patient ID: Joshua Arroyo, male    DOB: 07/22/1941, 77 y.o.   MRN: [id number]  Chief Complaint  Patient presents with  . New Patient (Initial Visit)    lyme disease Dx.     HPI:  Joshua Arroyo is a 77 y.o. male who presents today for initial evaluation and treatment of a tick bite with concern for Lyme disease.  [name] experience a tick bite in early May located in his right thigh. Unsure of the exact time tick may have been present, however notes the tick was embedded and removed without difficulty. Described as very tiny and not engorged. He did experience a rash located on his lower-right extremity and left lateral knee approximately 2 weeks later. He then began to experience chills and feelings of generalized malaise with low energy about 1 week prior to being seen by his primary care office. His primary care office tested him for [facility] spotted fever and Lyme disease. [facility] spotted fever was negative with blood work positive for IgM resulting in primary care office diagnosing Lyme disease. He was started on doxycycline and reports that he has been taking the doxycycline as prescribed with no adverse side effects with the exception of constipation. He is completed approximately 24 days of medication. He continues to experience the associated symptoms of fatigue and feeling a "hot head" that others feel is normal to the touch. Also describes feeling his whole body is hot at times. He also does continue to experience decreased energy and overall not feeling well. Denies fevers recently. Office notes and lab work reviewed from primary care office which are not consistent with Lyme disease.   No Known Allergies   Outpatient Medications Prior to Visit  Medication Sig Dispense Refill  . aspirin 81 MG tablet Take 81 mg by mouth daily.    . DiphenhydrAMINE HCl, Sleep, (NIGHTTIME SLEEP AID) 50 MG tablet Take 50 mg by mouth at bedtime as needed for sleep.    Marland Kitchen  doxycycline (VIBRAMYCIN) 100 MG capsule Take 100 mg by mouth 2 (two) times daily.  0  . FIBER PO Take 2 scoop by mouth daily as needed (fiber).    . finasteride (PROSCAR) 5 MG tablet Take 5 mg by mouth daily.  0  . furosemide (LASIX) 20 MG tablet Take 1 tablet by mouth daily.  0  . losartan-hydrochlorothiazide (HYZAAR) 100-12.5 MG per tablet Take 1 tablet by mouth every morning.  0  . MAGNESIUM PO Take 2 tablets by mouth daily as needed (magnesium supplement).    . Multiple Vitamins-Minerals (ONE DAILY MENS HEALTH PO) Take 1 capsule by mouth daily.     . OMEGA-3 FATTY ACIDS PO Take 2 tablets by mouth in the morning and 1 tablet in the eveving    . pravastatin (PRAVACHOL) 40 MG tablet Take 40 mg by mouth daily.    Marland Kitchen PROTEIN PO Take 1 scoop by mouth daily as needed (protein supplement).    . sildenafil (VIAGRA) 100 MG tablet Take 100 mg by mouth daily as needed for erectile dysfunction.     . sotalol (BETAPACE) 80 MG tablet Take 1 tablet (80 mg total) by mouth 2 (two) times daily. 180 tablet 2  . tamsulosin (FLOMAX) 0.4 MG CAPS Take 0.4 mg by mouth daily.    . vitamin B-12 (CYANOCOBALAMIN) 250 MCG tablet Take 250 mcg by mouth daily.    Marland Kitchen DOXYCYCLINE HYCLATE PO Take 100 mg by mouth.    Marland Kitchen  amLODipine (NORVASC) 5 MG tablet Take 0.5 tablets by mouth daily.  0   No facility-administered medications prior to visit.      Past Medical History:  Diagnosis Date  . Atrial flutter ([place])    post ablation  . BPH (benign prostatic hyperplasia)   . Hypercholesteremia   . Hypertension      Past Surgical History:  Procedure Laterality Date  . CARDIOVERSION    . CERVICAL SPINE SURGERY  2011    Review of Systems  Constitutional: Positive for chills and fatigue.  Respiratory: Negative for cough, chest tightness and shortness of breath.   Cardiovascular: Negative for chest pain, palpitations and leg swelling.  Gastrointestinal: Positive for constipation. Negative for abdominal distention, abdominal  pain, diarrhea and nausea.  Hematological: Negative for adenopathy.  Psychiatric/Behavioral: Negative for behavioral problems and confusion.      Objective:    BP 115/70   Pulse (!) 46   Temp 97.6 F (36.4 C)   Ht 6' (1.829 m)   Wt 231 lb (104.8 kg)   BMI 31.33 kg/m  Nursing note and vital signs reviewed.  Physical Exam  Constitutional: He is oriented to person, place, and time. He appears well-developed and well-nourished. No distress.  Cardiovascular: Normal rate, regular rhythm, normal heart sounds and intact distal pulses. Exam reveals no gallop and no friction rub.  No murmur heard. Pulmonary/Chest: Effort normal and breath sounds normal. No stridor. No respiratory distress. He has no wheezes. He has no rales. He exhibits no tenderness.  Abdominal: Soft. Bowel sounds are normal. He exhibits no distension.  Neurological: He is alert and oriented to person, place, and time.  Skin: Skin is warm and dry.  Psychiatric: He has a normal mood and affect. His behavior is normal. Judgment and thought content normal.       Assessment & Plan:   Problem List Items Addressed This Visit      Musculoskeletal and Integument   Tick bite    Mr. Stemm presents today with concern for a tick bite resulting in Lyme disease. Review of his blood work is not consistent with the CDC established testing results for Lyme disease. In order to be considered positive the IgM test must be completed within the first 4 weeks of disease onset for it to be considered positive and in [name] case was greater than 4 weeks. In addition his IgG was negative which we would anticipate being positive based on his timeframe. Therefore it does not appear that he has Lyme Disease. Recommend no further antibiotics at this time. He likely should have additional work up for metabolic and cardiac sources that could be leading to his current symptoms. Per his request we will test one additional time. No follow up with ID is  needed at this time.       Relevant Orders   B. Burgdorfi Antibodies by WB   B. Burgdorfi Antibodies       I have discontinued Less R. Eichel's DOXYCYCLINE HYCLATE PO. I am also having him maintain his sildenafil, Multiple Vitamins-Minerals (ONE DAILY MENS HEALTH PO), OMEGA-3 FATTY ACIDS PO, aspirin, diphenhydrAMINE HCl (Sleep), pravastatin, tamsulosin, sotalol, losartan-hydrochlorothiazide, amLODipine, furosemide, MAGNESIUM PO, PROTEIN PO, FIBER PO, finasteride, vitamin B-12, and doxycycline.   Follow-up: Return if symptoms worsen or fail to improve.   Terri Piedra, MSN, FNP-C Nurse Practitioner [facility] for Infectious Disease [place] [facility] Office phone: [phone] Pager: [place] number: [phone]

## 2018-06-22 NOTE — Assessment & Plan Note (Signed)
Joshua Arroyo presents today with concern for a tick bite resulting in Lyme disease. Review of his blood work is not consistent with the CDC established testing results for Lyme disease. In order to be considered positive the IgM test must be completed within the first 4 weeks of disease onset for it to be considered positive and in Joshua Arroyo case was greater than 4 weeks. In addition his IgG was negative which we would anticipate being positive based on his timeframe. Therefore it does not appear that he has Lyme Disease. Recommend no further antibiotics at this time. He likely should have additional work up for metabolic and cardiac sources that could be leading to his current symptoms. Per his request we will test one additional time. No follow up with ID is needed at this time.

## 2018-06-22 NOTE — Patient Instructions (Addendum)
Nice to meet you.  We will check your blood work today.  Recommend no further antibiotic treatment as it does not appear that you have Lyme disease.  Recommend exploring further other metabolic factors like cardiovascular disease, testosterone, thyroid, or nutrition.

## 2018-06-24 ENCOUNTER — Other Ambulatory Visit: Payer: Self-pay | Admitting: Gastroenterology

## 2018-06-24 DIAGNOSIS — Z86010 Personal history of colonic polyps: Secondary | ICD-10-CM

## 2018-06-24 DIAGNOSIS — K5900 Constipation, unspecified: Secondary | ICD-10-CM

## 2018-06-25 LAB — B. BURGDORFI ANTIBODIES BY WB
B burgdorferi IgG Abs (IB): NEGATIVE
B burgdorferi IgM Abs (IB): POSITIVE — AB
Lyme Disease 18 kD IgG: NONREACTIVE
Lyme Disease 23 kD IgG: REACTIVE — AB
Lyme Disease 23 kD IgM: REACTIVE — AB
Lyme Disease 28 kD IgG: NONREACTIVE
Lyme Disease 30 kD IgG: NONREACTIVE
Lyme Disease 39 kD IgG: NONREACTIVE
Lyme Disease 39 kD IgM: NONREACTIVE
Lyme Disease 41 kD IgG: REACTIVE — AB
Lyme Disease 41 kD IgM: REACTIVE — AB
Lyme Disease 45 kD IgG: NONREACTIVE
Lyme Disease 58 kD IgG: REACTIVE — AB
Lyme Disease 66 kD IgG: REACTIVE — AB
Lyme Disease 93 kD IgG: NONREACTIVE

## 2018-06-25 LAB — LYME AB SCREEN %: Lyme AB Screen: 8.31 index — ABNORMAL HIGH

## 2018-06-30 ENCOUNTER — Telehealth: Payer: Self-pay | Admitting: *Deleted

## 2018-06-30 ENCOUNTER — Ambulatory Visit
Admission: RE | Admit: 2018-06-30 | Discharge: 2018-06-30 | Disposition: A | Discharge status: Home / Self Care | Payer: Medicare HMO | Source: Ambulatory Visit | Attending: Gastroenterology | Admitting: Gastroenterology

## 2018-06-30 DIAGNOSIS — Z86010 Personal history of colonic polyps: Secondary | ICD-10-CM

## 2018-06-30 DIAGNOSIS — K5900 Constipation, unspecified: Secondary | ICD-10-CM

## 2018-06-30 DIAGNOSIS — K635 Polyp of colon: Secondary | ICD-10-CM | POA: Diagnosis not present

## 2018-06-30 NOTE — Telephone Encounter (Signed)
Left voicemail with lab interpretation, instructions to call if needed or contact via mychart if any additional questions. Landis Gandy, RN

## 2018-06-30 NOTE — Telephone Encounter (Signed)
-----   Message from [place], [place] sent at 06/28/2018  8:15 AM EDT ----- Please inform patient that his blood work has returned and continues to support that he is negative for Lyme Disease. Continue with work up as discussed during visit. We are happy to see him back as needed.

## 2018-07-05 ENCOUNTER — Encounter (INDEPENDENT_AMBULATORY_CARE_PROVIDER_SITE_OTHER): Payer: Self-pay | Admitting: Orthopaedic Surgery

## 2018-07-05 ENCOUNTER — Ambulatory Visit (INDEPENDENT_AMBULATORY_CARE_PROVIDER_SITE_OTHER): Payer: Medicare HMO | Admitting: Orthopaedic Surgery

## 2018-07-05 VITALS — BP 111/72 | HR 53 | Ht 72.0 in | Wt 230.0 lb

## 2018-07-05 DIAGNOSIS — M1712 Unilateral primary osteoarthritis, left knee: Secondary | ICD-10-CM

## 2018-07-05 NOTE — Progress Notes (Signed)
Office Visit Note   Patient: Joshua Arroyo           Date of Birth: [birth year]           MRN: [id number] Visit Date: 07/05/2018              Requested by: Joshua Dus, MD [address] [place], [place] [id number] PCP: Joshua Dus, MD   Assessment & Plan: Visit Diagnoses:  1. Unilateral primary osteoarthritis, left knee     Plan: Flareup of osteoarthritis left knee possibly related to recent febrile illness.  Presently doing well.  Continue with pullover knee support.  Return to the office at any time for consideration of cortisone injection.  Follow-Up Instructions: Return if symptoms worsen or fail to improve.   Orders:  No orders of the defined types were placed in this encounter.  No orders of the defined types were placed in this encounter.     Procedures: No procedures performed   Clinical Data: No additional findings.   Subjective: Chief Complaint  Patient presents with  . Follow-up    L KNEE PAIN 2-3 YRS OFF AND ON GETTING WORSE HAS SWELLING BUT NO NUMBNESS. NO INJURY  Joshua Arroyo visited the office for evaluation of left knee pain.  He has an established diagnosis of osteoarthritis by virtue of a prior knee arthroscopy and follow-up films.  Last films in August 2018 revealed at least 50 to 60% narrowing of the medial joint space.  He has had some aches and pains but recently had an exacerbation of his pain after febrile illness in May.  He found a small tick on his right thigh and then developed a rash with fever and chills and joint pain.  He was seen by his primary care doctor with a possible diagnosis of Lyme's disease.  He was eventually seen at the infectious disease department with lab work negative for Lyme disease.  The fever and chills and joint pains have resolved however still having some discomfort in his left knee.  He has had recurrent swelling and pain.  He is actually much better over the last several days with time and wearing a pullover  knee support.  Did take a 30-day course of doxycycline despite the negative Lyme disease test.  He was also negative for [facility] spotted fever  HPI  Review of Systems  Constitutional: Negative for fatigue and fever.  HENT: Negative for ear pain.   Eyes: Negative for pain.  Respiratory: Negative for cough and shortness of breath.   Cardiovascular: Positive for leg swelling.  Gastrointestinal: Negative for constipation and diarrhea.  Genitourinary: Negative for difficulty urinating.  Musculoskeletal: Negative for back pain and neck pain.  Skin: Negative for rash.  Allergic/Immunologic: Negative for food allergies.  Neurological: Positive for weakness. Negative for numbness.  Hematological: Does not bruise/bleed easily.  Psychiatric/Behavioral: Negative for sleep disturbance.     Objective: Vital Signs: BP 111/72 (BP Location: Left Arm, Patient Position: Sitting, Cuff Size: Normal)   Pulse (!) 53   Ht 6' (1.829 m)   Wt 230 lb (104.3 kg)   BMI 31.19 kg/m   Physical Exam  Constitutional: He is oriented to person, place, and time. He appears well-developed and well-nourished.  HENT:  Mouth/Throat: Oropharynx is clear and moist.  Eyes: Pupils are equal, round, and reactive to light. EOM are normal.  Pulmonary/Chest: Effort normal.  Neurological: He is alert and oriented to person, place, and time.  Skin: Skin is warm  and dry.  Psychiatric: He has a normal mood and affect. His behavior is normal.    Ortho Exam awake alert and oriented x3.  Comfortable sitting.  Knee support left knee was removed.  No knee effusion.  Knee was not hot warm or red.  No significant medial or lateral joint pain.  No opening with varus or valgus stress.  No calf pain.  No heel discomfort  Specialty Comments:  No specialty comments available.  Imaging: No results found.   PMFS History: Patient Active Problem List   Diagnosis Date Noted  . Tick bite 06/22/2018  . Essential hypertension  04/05/2015  . Atrial tachycardia ([place]) 06/07/2013  . Mild cognitive impairment 05/17/2013  . Sleep disturbance, unspecified 05/17/2013   Past Medical History:  Diagnosis Date  . Atrial flutter ([place])    post ablation  . BPH (benign prostatic hyperplasia)   . Hypercholesteremia   . Hypertension     Family History  Problem Relation Age of Onset  . Heart Problems Father     Past Surgical History:  Procedure Laterality Date  . CARDIOVERSION    . CERVICAL SPINE SURGERY  2011   Social History   Occupational History  . Not on file  Tobacco Use  . Smoking status: Never Smoker  . Smokeless tobacco: Never Used  Substance and Sexual Activity  . Alcohol use: Yes    Comment: Socially  . Drug use: No  . Sexual activity: Not on file

## 2018-07-06 ENCOUNTER — Ambulatory Visit (INDEPENDENT_AMBULATORY_CARE_PROVIDER_SITE_OTHER): Payer: Medicare HMO | Admitting: Family

## 2018-07-06 ENCOUNTER — Encounter: Payer: Self-pay | Admitting: Family

## 2018-07-06 VITALS — BP 107/70 | HR 54 | Temp 97.4°F | Ht 72.0 in | Wt 229.0 lb

## 2018-07-06 DIAGNOSIS — W57XXXA Bitten or stung by nonvenomous insect and other nonvenomous arthropods, initial encounter: Secondary | ICD-10-CM

## 2018-07-06 DIAGNOSIS — A6920 Lyme disease, unspecified: Secondary | ICD-10-CM

## 2018-07-06 MED ORDER — DOXYCYCLINE HYCLATE 100 MG PO TABS: 100.0000 mg | ORAL_TABLET | Freq: Two times a day (BID) | ORAL | 0 refills | Status: DC

## 2018-07-06 NOTE — Patient Instructions (Signed)
Nice to see you.  It appears unlikely any transmission of Lyme disease.  Basic wound care with soap and water.   A prescription of doxycycline has been provided for you.  Follow up as needed.

## 2018-07-06 NOTE — Progress Notes (Signed)
Subjective:    Patient ID: Joshua Arroyo, male    DOB: 05/24/1941, 77 y.o.   MRN: [id number]  Chief Complaint  Patient presents with  . Follow-up    NEW TICK BITE     HPI:  Joshua Arroyo is a 77 y.o. male who presents today for follow up office visit for a tick bite.  Mr. Gunnoe was last seen in the office on 7/30 with concern for Lyme disease. Review of lab work from previous testing and testing from 7/30 not supporting the diagnosis of Lyme Disease.  Mr. 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 They were embeded however not engorged. He was able to successfully remove the ticks without complication. He has no fevers, chills, sweats or rashes. He has just completed the course of doxycycline about 1 week ago.    No Known Allergies    Outpatient Medications Prior to Visit  Medication Sig Dispense Refill  . amLODipine (NORVASC) 5 MG tablet Take 0.5 tablets by mouth daily.  0  . aspirin 81 MG tablet Take 81 mg by mouth daily.    . DiphenhydrAMINE HCl, Sleep, (NIGHTTIME SLEEP AID) 50 MG tablet Take 50 mg by mouth at bedtime as needed for sleep.    Marland Kitchen FIBER PO Take 2 scoop by mouth daily as needed (fiber).    . finasteride (PROSCAR) 5 MG tablet Take 5 mg by mouth daily.  0  . furosemide (LASIX) 20 MG tablet Take 1 tablet by mouth daily.  0  . losartan-hydrochlorothiazide (HYZAAR) 100-12.5 MG per tablet Take 1 tablet by mouth every morning.  0  . MAGNESIUM PO Take 2 tablets by mouth daily as needed (magnesium supplement).    . Multiple Vitamins-Minerals (ONE DAILY MENS HEALTH PO) Take 1 capsule by mouth daily.     . OMEGA-3 FATTY ACIDS PO Take 2 tablets by mouth in the morning and 1 tablet in the eveving    . pravastatin (PRAVACHOL) 40 MG tablet Take 40 mg by mouth daily.    Marland Kitchen PROTEIN PO Take 1 scoop by mouth daily as needed (protein supplement).    . sildenafil (VIAGRA) 100 MG tablet Take  100 mg by mouth daily as needed for erectile dysfunction.     . sotalol (BETAPACE) 80 MG tablet Take 1 tablet (80 mg total) by mouth 2 (two) times daily. 180 tablet 2  . tamsulosin (FLOMAX) 0.4 MG CAPS Take 0.4 mg by mouth daily.    . vitamin B-12 (CYANOCOBALAMIN) 250 MCG tablet Take 250 mcg by mouth daily.    Marland Kitchen doxycycline (VIBRAMYCIN) 100 MG capsule Take 100 mg by mouth 2 (two) times daily.  0   No facility-administered medications prior to visit.      Past Medical History:  Diagnosis Date  . Atrial flutter ([place])    post ablation  . BPH (benign prostatic hyperplasia)   . Hypercholesteremia   . Hypertension      Past Surgical History:  Procedure Laterality Date  . CARDIOVERSION    . CERVICAL SPINE SURGERY  2011     Review of Systems  Constitutional: Negative for chills and fever.  Respiratory: Negative for cough, chest tightness, shortness of breath and wheezing.   Cardiovascular: Negative for chest pain and palpitations.  Skin: Negative for color change, rash and wound.      Objective:    BP 107/70   Pulse Marland Kitchen)  54   Temp (!) 97.4 F (36.3 C)   Ht 6' (1.829 m)   Wt 229 lb (103.9 kg)   SpO2 95% Comment: ROOM AIR  BMI 31.06 kg/m  Nursing note and vital signs reviewed.  Physical Exam  Constitutional: He is oriented to person, place, and time. He appears well-developed and well-nourished. No distress.  Cardiovascular: Normal rate, regular rhythm, normal heart sounds and intact distal pulses.  Pulmonary/Chest: Effort normal and breath sounds normal.  Neurological: He is alert and oriented to person, place, and time.  Skin: Skin is warm and dry.  Psychiatric: He has a normal mood and affect. His behavior is normal. Judgment and thought content normal.       Assessment & Plan:   Problem List Items Addressed This Visit      Musculoskeletal and Integument   Tick bite - Primary    New tick bites since previous office visit with little concern for potential Lyme  disease transmission as they were embedded for less than 24 hours and not engorged. Advised to continue with basic wound care and may have some redness and swelling along the course of the normal inflammatory process. Written prescription for doxycycline provided at patient request. Recommend using bug/tick repellent prior to working outside to prevent further bites. No further treatment is needed at this time and may follow up as needed.           I have discontinued Lasalle R. Deas's doxycycline. I am also having him start on doxycycline. Additionally, I am having him maintain his sildenafil, Multiple Vitamins-Minerals (ONE DAILY MENS HEALTH PO), OMEGA-3 FATTY ACIDS PO, aspirin, diphenhydrAMINE HCl (Sleep), pravastatin, tamsulosin, sotalol, losartan-hydrochlorothiazide, amLODipine, furosemide, MAGNESIUM PO, PROTEIN PO, FIBER PO, finasteride, and vitamin B-12.   Meds ordered this encounter  Medications  . doxycycline (VIBRA-TABS) 100 MG tablet    Sig: Take 1 tablet (100 mg total) by mouth 2 (two) times daily.    Dispense:  20 tablet    Refill:  0    Order Specific Question:   Supervising Provider    Answer:   Carlyle Basques [[id number]]     Follow-up: As needed.   Terri Piedra, MSN, FNP-C Nurse Practitioner [facility] for Infectious Disease [place] [facility] Office phone: [phone] Pager: [place] number: [phone]

## 2018-07-06 NOTE — Assessment & Plan Note (Signed)
New tick bites since previous office visit with little concern for potential Lyme disease transmission as they were embedded for less than 24 hours and not engorged. Advised to continue with basic wound care and may have some redness and swelling along the course of the normal inflammatory process. Written prescription for doxycycline provided at patient request. Recommend using bug/tick repellent prior to working outside to prevent further bites. No further treatment is needed at this time and may follow up as needed.

## 2018-07-07 ENCOUNTER — Ambulatory Visit: Payer: Medicare HMO | Admitting: Internal Medicine

## 2018-07-07 ENCOUNTER — Encounter: Payer: Self-pay | Admitting: Internal Medicine

## 2018-07-07 VITALS — BP 100/62 | HR 56 | Ht 72.0 in | Wt 232.4 lb

## 2018-07-07 DIAGNOSIS — I471 Supraventricular tachycardia: Secondary | ICD-10-CM | POA: Diagnosis not present

## 2018-07-07 DIAGNOSIS — I10 Essential (primary) hypertension: Secondary | ICD-10-CM

## 2018-07-07 NOTE — Progress Notes (Signed)
HPI [name] returns today for followup of his atrial tachycardia. He has been bitten by a tick on 2 occaisions. After the first, he developed a rash, fever, and chills and was treated with over a month of anti-biotics. His Lyme serologies were reportedly negative. He was bitten by another tick a few days ago. During this time, his heart rate has been a little lower, in the 50-60 range. He does not have any symptomatic arrhythmias. No Known Allergies   Current Outpatient Medications  Medication Sig Dispense Refill  . aspirin 81 MG tablet Take 81 mg by mouth daily.    . DiphenhydrAMINE HCl, Sleep, (NIGHTTIME SLEEP AID) 50 MG tablet Take 50 mg by mouth at bedtime as needed for sleep.    Marland Kitchen FIBER PO Take 2 scoop by mouth daily as needed (fiber).    . finasteride (PROSCAR) 5 MG tablet Take 5 mg by mouth daily.  0  . furosemide (LASIX) 20 MG tablet Take 1 tablet by mouth daily.  0  . losartan-hydrochlorothiazide (HYZAAR) 100-12.5 MG per tablet Take 1 tablet by mouth every morning.  0  . MAGNESIUM PO Take 2 tablets by mouth daily as needed (magnesium supplement).    . Multiple Vitamins-Minerals (ONE DAILY MENS HEALTH PO) Take 1 capsule by mouth daily.     . OMEGA-3 FATTY ACIDS PO Take 2 tablets by mouth in the morning and 1 tablet in the eveving    . pravastatin (PRAVACHOL) 40 MG tablet Take 40 mg by mouth daily.    Marland Kitchen PROTEIN PO Take 1 scoop by mouth daily as needed (protein supplement).    . sildenafil (VIAGRA) 100 MG tablet Take 100 mg by mouth daily as needed for erectile dysfunction.     . sotalol (BETAPACE) 80 MG tablet Take 1 tablet (80 mg total) by mouth 2 (two) times daily. 180 tablet 2  . tamsulosin (FLOMAX) 0.4 MG CAPS Take 0.4 mg by mouth daily.    . vitamin B-12 (CYANOCOBALAMIN) 250 MCG tablet Take 250 mcg by mouth daily.     No current facility-administered medications for this visit.      Past Medical History:  Diagnosis Date  . Atrial flutter ([place])    post ablation    . BPH (benign prostatic hyperplasia)   . Hypercholesteremia   . Hypertension     ROS:   All systems reviewed and negative except as noted in the HPI.   Past Surgical History:  Procedure Laterality Date  . CARDIOVERSION    . CERVICAL SPINE SURGERY  2011     Family History  Problem Relation Age of Onset  . Heart Problems Father      Social History   Socioeconomic History  . Marital status: Married    Spouse name: Tye [place]  . Number of children: 3  . Years of education: MA  . Highest education level: Not on file  Occupational History  . Not on file  Social Needs  . Financial resource strain: Not on file  . Food insecurity:    Worry: Not on file    Inability: Not on file  . Transportation needs:    Medical: Not on file    Non-medical: Not on file  Tobacco Use  . Smoking status: Never Smoker  . Smokeless tobacco: Never Used  Substance and Sexual Activity  . Alcohol use: Yes    Comment: Socially  . Drug use: No  . Sexual activity: Not on file  Lifestyle  .  Physical activity:    Days per week: Not on file    Minutes per session: Not on file  . Stress: Not on file  Relationships  . Social connections:    Talks on phone: Not on file    Gets together: Not on file    Attends religious service: Not on file    Active member of club or organization: Not on file    Attends meetings of clubs or organizations: Not on file    Relationship status: Not on file  . Intimate partner violence:    Fear of current or ex partner: Not on file    Emotionally abused: Not on file    Physically abused: Not on file    Forced sexual activity: Not on file  Other Topics Concern  . Not on file  Social History Narrative   Pt lives at home with his spouse.   Caffeine Use: 1 cup of coffee daily     BP 100/62   Pulse (!) 56   Ht 6' (1.829 m)   Wt 232 lb 6.4 oz (105.4 kg)   SpO2 97%   BMI 31.52 kg/m   Physical Exam:  Well appearing 77 yo man, NAD HEENT: Unremarkable Neck:   6 cm JVD, no thyromegally Lymphatics:  No adenopathy Back:  No CVA tenderness Lungs:  Clear with no wheezes HEART:  Regular rate rhythm, no murmurs, no rubs, no clicks Abd:  soft, positive bowel sounds, no organomegally, no rebound, no guarding Ext:  2 plus pulses, no edema, no cyanosis, no clubbing Skin:  No rashes no nodules Neuro:  CN II through XII intact, motor grossly intact  EKG - nsr  Assess/Plan: 1. Atrial tachy - his symptoms are controlled. I have encouraged him to continue sotalol as long as his HR is over 50. 2. Tick bite - I discussed the importance of prevention. The bites have all occurred when he is working in the yard and I have encouraged him to survey his skin and obtain some permethrin treated clothing. 3. Sinus brady - he is asymptomatic. No indication that this is due to lyme disease. No heart block. 4. HTN - his blood pressure is well controlled. No change in meds.  Mikle Bosworth.D.

## 2018-07-07 NOTE — Patient Instructions (Addendum)
Medication Instructions:  Your physician has recommended you make the following change in your medication:  1.  If your heart rate is under 50 beats per minute, decrease your sotalol 80 mg to 1/2 tablet by mouth twice a day.  Get [employer]- that is the name of the company  Labwork: None ordered.  Testing/Procedures: None ordered.  Follow-Up: Your physician wants you to follow-up in: one year with Dr. Lovena Le.   You will receive a reminder letter in the mail two months in advance. If you don't receive a letter, please call our office to schedule the follow-up appointment.  Any Other Special Instructions Will Be Listed Below (If Applicable).  If you need a refill on your cardiac medications before your next appointment, please call your pharmacy.

## 2018-07-08 ENCOUNTER — Other Ambulatory Visit: Payer: Self-pay | Admitting: Family

## 2018-07-08 MED ORDER — DOXYCYCLINE HYCLATE 100 MG PO TABS: 100.0000 mg | ORAL_TABLET | Freq: Two times a day (BID) | ORAL | 0 refills | Status: DC

## 2018-07-15 ENCOUNTER — Ambulatory Visit: Payer: Medicare HMO | Admitting: Physician Assistant

## 2018-08-31 DIAGNOSIS — I4892 Unspecified atrial flutter: Secondary | ICD-10-CM | POA: Diagnosis not present

## 2018-08-31 DIAGNOSIS — I10 Essential (primary) hypertension: Secondary | ICD-10-CM | POA: Diagnosis not present

## 2018-08-31 DIAGNOSIS — Z0000 Encounter for general adult medical examination without abnormal findings: Secondary | ICD-10-CM | POA: Diagnosis not present

## 2018-08-31 DIAGNOSIS — G4700 Insomnia, unspecified: Secondary | ICD-10-CM | POA: Diagnosis not present

## 2018-08-31 DIAGNOSIS — S61412A Laceration without foreign body of left hand, initial encounter: Secondary | ICD-10-CM | POA: Diagnosis not present

## 2018-08-31 DIAGNOSIS — Z6830 Body mass index (BMI) 30.0-30.9, adult: Secondary | ICD-10-CM | POA: Diagnosis not present

## 2018-08-31 DIAGNOSIS — N400 Enlarged prostate without lower urinary tract symptoms: Secondary | ICD-10-CM | POA: Diagnosis not present

## 2018-08-31 DIAGNOSIS — R609 Edema, unspecified: Secondary | ICD-10-CM | POA: Diagnosis not present

## 2018-08-31 DIAGNOSIS — Z23 Encounter for immunization: Secondary | ICD-10-CM | POA: Diagnosis not present

## 2018-08-31 DIAGNOSIS — L219 Seborrheic dermatitis, unspecified: Secondary | ICD-10-CM | POA: Diagnosis not present

## 2018-09-13 ENCOUNTER — Telehealth (INDEPENDENT_AMBULATORY_CARE_PROVIDER_SITE_OTHER): Payer: Self-pay | Admitting: Physical Medicine and Rehabilitation

## 2018-09-13 ENCOUNTER — Telehealth (INDEPENDENT_AMBULATORY_CARE_PROVIDER_SITE_OTHER): Payer: Self-pay | Admitting: Orthopaedic Surgery

## 2018-09-13 DIAGNOSIS — G8929 Other chronic pain: Secondary | ICD-10-CM | POA: Diagnosis not present

## 2018-09-13 DIAGNOSIS — M1711 Unilateral primary osteoarthritis, right knee: Secondary | ICD-10-CM | POA: Diagnosis not present

## 2018-09-13 DIAGNOSIS — M25562 Pain in left knee: Secondary | ICD-10-CM | POA: Diagnosis not present

## 2018-09-13 DIAGNOSIS — M1712 Unilateral primary osteoarthritis, left knee: Secondary | ICD-10-CM | POA: Diagnosis not present

## 2018-09-13 NOTE — Telephone Encounter (Signed)
done

## 2018-09-13 NOTE — Telephone Encounter (Signed)
Please correct patients name on the 11/04/2017 dictation on 1st sentence of 1st paragraph

## 2018-09-13 NOTE — Telephone Encounter (Signed)
error 

## 2018-09-13 NOTE — Telephone Encounter (Signed)
Patient called needing to get copy of his records by 3:00. I told him I would have ready and he will sign release form when he comes in to pickup

## 2018-09-14 NOTE — Telephone Encounter (Signed)
Thank you :)

## 2018-09-16 ENCOUNTER — Ambulatory Visit (INDEPENDENT_AMBULATORY_CARE_PROVIDER_SITE_OTHER): Payer: Medicare HMO | Admitting: Orthopaedic Surgery

## 2018-11-01 DIAGNOSIS — S8982XA Other specified injuries of left lower leg, initial encounter: Secondary | ICD-10-CM | POA: Diagnosis not present

## 2018-11-04 ENCOUNTER — Telehealth: Payer: Self-pay | Admitting: Internal Medicine

## 2018-11-04 DIAGNOSIS — I471 Supraventricular tachycardia: Secondary | ICD-10-CM

## 2018-11-04 MED ORDER — SOTALOL HCL 80 MG PO TABS: 80.0000 mg | ORAL_TABLET | Freq: Two times a day (BID) | ORAL | 3 refills | Status: DC

## 2018-11-04 NOTE — Telephone Encounter (Signed)
See mychart message.  No further action needed on this thread. 

## 2018-11-04 NOTE — Telephone Encounter (Signed)
New message   Pt c/o medication issue:  1. Name of Medication: sotalol (BETAPACE) 80 MG tablet  2. How are you currently taking this medication (dosage and times per day)? 2 times daily  3. Are you having a reaction (difficulty breathing--STAT)? No    4. What is your medication issue?Patient needs clarification on the dosage of this medication.  Please advise.

## 2018-11-15 DIAGNOSIS — M1712 Unilateral primary osteoarthritis, left knee: Secondary | ICD-10-CM | POA: Diagnosis not present

## 2018-11-22 ENCOUNTER — Other Ambulatory Visit: Payer: Self-pay | Admitting: Orthopedic Surgery

## 2018-11-22 DIAGNOSIS — M25562 Pain in left knee: Secondary | ICD-10-CM

## 2018-12-03 ENCOUNTER — Ambulatory Visit
Admission: RE | Admit: 2018-12-03 | Discharge: 2018-12-03 | Disposition: A | Discharge status: Home / Self Care | Payer: Medicare HMO | Source: Ambulatory Visit | Attending: Orthopedic Surgery | Admitting: Orthopedic Surgery

## 2018-12-03 DIAGNOSIS — M25562 Pain in left knee: Secondary | ICD-10-CM

## 2018-12-09 ENCOUNTER — Telehealth: Payer: Self-pay | Admitting: *Deleted

## 2018-12-09 DIAGNOSIS — S83242A Other tear of medial meniscus, current injury, left knee, initial encounter: Secondary | ICD-10-CM | POA: Diagnosis not present

## 2018-12-09 NOTE — Telephone Encounter (Signed)
Tried to call the surgeons office to verify type of surgery though office was closed. I will call tomorrow.

## 2018-12-13 NOTE — Telephone Encounter (Signed)
F/U       Patient is calling today to verify the type of surgery. Pls call patient/office to verify.

## 2018-12-13 NOTE — Telephone Encounter (Signed)
I tried to call Dr Ruel Favors office [phone] to inquire about the patient's surgical procedure and anesthesia.  The patient said that they want to do the surgery 12/29/2018.

## 2018-12-14 NOTE — Telephone Encounter (Signed)
Left message today to call back and advise as to type of surgery, anesthesia and if date scheduled yet for surgery.

## 2018-12-15 NOTE — Telephone Encounter (Signed)
Pt called in today to see if he has been cleared for his surgery. I explained to the pt that I have left messages for Dr. Ruel Favors office. Need clarification on fax received for surgery clearance: need what procedure and what type of anesthesia and if they need any meds to be held and if so for how long. I explained all this to the pt. I assured I am have been working on this. Pt said he will try to reach Dr. Ruel Favors office. I did tell the pt that maybe they have called but may have a hard time getting through. I gave the pt my number directly to my desk [phone].

## 2018-12-15 NOTE — Telephone Encounter (Signed)
Left message to discuss pt's up coming procedure. Need to confirm type of procedure, type of anesthesia and if needing ASA held and if so how many days.

## 2018-12-15 NOTE — Telephone Encounter (Signed)
I s/w pt and told him that I have left a message x 2 for Dr. Ruel Favors office to call me as I am needing complete information in regards to his surgery. Please see recent phone note for further notes.

## 2018-12-16 NOTE — Telephone Encounter (Signed)
Left message x 3 needing further information. Please see previous phone notes.

## 2018-12-16 NOTE — Telephone Encounter (Signed)
   Primary Cardiologist: Dr Lovena Le  Chart reviewed and patient interviewed over the phone today as part of pre-operative protocol coverage. Based on ACC/AHA guidelines, Joshua Arroyo would be at acceptable risk for the planned procedure without further cardiovascular testing.   Ok to hold aspirin 3-5 days pre op if needed.  I will route this recommendation to the requesting party via Epic fax function and remove from pre-op pool.  Please call with questions.  Kerin Ransom, PA-C 12/16/2018, 3:33 PM

## 2018-12-16 NOTE — Telephone Encounter (Signed)
   [facility] Pre-operative Risk Assessment    Request for surgical clearance:  1. What type of surgery is being performed? FIRST SURGERY: LEFT KNEE ARTHROSCOPY W/MENIESECTOMY.....SECOND SURGERY WILL BE IN THE NEXT FEW MONTH   2. When is this surgery scheduled? 12/29/18   3. What type of clearance is required (medical clearance vs. Pharmacy clearance to hold med vs. Both)? MEDICAL  4. Are there any medications that need to be held prior to surgery and how long?ASA   5. Practice name and name of physician performing surgery? [facility]; DR. Ronnie Derby   6. What is your office phone number [phone]    7.   What is your office fax number [phone]  8.   Anesthesia type (None, local, MAC, general) ? FIRST SURGERY WILL BE WITH GENERAL AND SECOND SURGERY WILL BE SPINAL   Julaine Hua 12/16/2018, 9:39 AM  _________________________________________________________________   (provider comments below)

## 2018-12-29 DIAGNOSIS — G8918 Other acute postprocedural pain: Secondary | ICD-10-CM | POA: Diagnosis not present

## 2018-12-29 DIAGNOSIS — M94262 Chondromalacia, left knee: Secondary | ICD-10-CM | POA: Diagnosis not present

## 2018-12-29 DIAGNOSIS — M659 Synovitis and tenosynovitis, unspecified: Secondary | ICD-10-CM | POA: Diagnosis not present

## 2018-12-29 DIAGNOSIS — Y999 Unspecified external cause status: Secondary | ICD-10-CM | POA: Diagnosis not present

## 2018-12-29 DIAGNOSIS — M6752 Plica syndrome, left knee: Secondary | ICD-10-CM | POA: Diagnosis not present

## 2018-12-29 DIAGNOSIS — S83232A Complex tear of medial meniscus, current injury, left knee, initial encounter: Secondary | ICD-10-CM | POA: Diagnosis not present

## 2019-01-04 DIAGNOSIS — M25662 Stiffness of left knee, not elsewhere classified: Secondary | ICD-10-CM | POA: Diagnosis not present

## 2019-01-04 DIAGNOSIS — R29898 Other symptoms and signs involving the musculoskeletal system: Secondary | ICD-10-CM | POA: Diagnosis not present

## 2019-01-04 DIAGNOSIS — R262 Difficulty in walking, not elsewhere classified: Secondary | ICD-10-CM | POA: Diagnosis not present

## 2019-01-04 DIAGNOSIS — S83232A Complex tear of medial meniscus, current injury, left knee, initial encounter: Secondary | ICD-10-CM | POA: Diagnosis not present

## 2019-01-13 DIAGNOSIS — R29898 Other symptoms and signs involving the musculoskeletal system: Secondary | ICD-10-CM | POA: Diagnosis not present

## 2019-01-13 DIAGNOSIS — R262 Difficulty in walking, not elsewhere classified: Secondary | ICD-10-CM | POA: Diagnosis not present

## 2019-01-13 DIAGNOSIS — S83232A Complex tear of medial meniscus, current injury, left knee, initial encounter: Secondary | ICD-10-CM | POA: Diagnosis not present

## 2019-01-13 DIAGNOSIS — M25662 Stiffness of left knee, not elsewhere classified: Secondary | ICD-10-CM | POA: Diagnosis not present

## 2019-01-25 DIAGNOSIS — M1712 Unilateral primary osteoarthritis, left knee: Secondary | ICD-10-CM | POA: Diagnosis not present

## 2019-01-25 DIAGNOSIS — M25662 Stiffness of left knee, not elsewhere classified: Secondary | ICD-10-CM | POA: Diagnosis not present

## 2019-01-25 DIAGNOSIS — R262 Difficulty in walking, not elsewhere classified: Secondary | ICD-10-CM | POA: Diagnosis not present

## 2019-01-25 DIAGNOSIS — M25462 Effusion, left knee: Secondary | ICD-10-CM | POA: Diagnosis not present

## 2019-01-25 DIAGNOSIS — Z9889 Other specified postprocedural states: Secondary | ICD-10-CM | POA: Diagnosis not present

## 2019-03-10 DIAGNOSIS — M1712 Unilateral primary osteoarthritis, left knee: Secondary | ICD-10-CM | POA: Diagnosis not present

## 2019-03-14 DIAGNOSIS — R609 Edema, unspecified: Secondary | ICD-10-CM | POA: Diagnosis not present

## 2019-03-30 DIAGNOSIS — R609 Edema, unspecified: Secondary | ICD-10-CM | POA: Diagnosis not present

## 2019-03-30 DIAGNOSIS — I4892 Unspecified atrial flutter: Secondary | ICD-10-CM | POA: Diagnosis not present

## 2019-03-30 DIAGNOSIS — N400 Enlarged prostate without lower urinary tract symptoms: Secondary | ICD-10-CM | POA: Diagnosis not present

## 2019-03-30 DIAGNOSIS — I10 Essential (primary) hypertension: Secondary | ICD-10-CM | POA: Diagnosis not present

## 2019-03-30 DIAGNOSIS — Z1389 Encounter for screening for other disorder: Secondary | ICD-10-CM | POA: Diagnosis not present

## 2019-03-30 DIAGNOSIS — Z125 Encounter for screening for malignant neoplasm of prostate: Secondary | ICD-10-CM | POA: Diagnosis not present

## 2019-03-30 DIAGNOSIS — L219 Seborrheic dermatitis, unspecified: Secondary | ICD-10-CM | POA: Diagnosis not present

## 2019-03-30 DIAGNOSIS — G4700 Insomnia, unspecified: Secondary | ICD-10-CM | POA: Diagnosis not present

## 2019-03-30 DIAGNOSIS — Z0000 Encounter for general adult medical examination without abnormal findings: Secondary | ICD-10-CM | POA: Diagnosis not present

## 2019-04-07 DIAGNOSIS — I10 Essential (primary) hypertension: Secondary | ICD-10-CM | POA: Diagnosis not present

## 2019-04-07 DIAGNOSIS — Z125 Encounter for screening for malignant neoplasm of prostate: Secondary | ICD-10-CM | POA: Diagnosis not present

## 2019-05-16 DIAGNOSIS — R69 Illness, unspecified: Secondary | ICD-10-CM | POA: Diagnosis not present

## 2019-06-06 DIAGNOSIS — G8929 Other chronic pain: Secondary | ICD-10-CM | POA: Diagnosis not present

## 2019-06-06 DIAGNOSIS — M25562 Pain in left knee: Secondary | ICD-10-CM | POA: Diagnosis not present

## 2019-07-17 NOTE — Progress Notes (Signed)
Cardiology Office Note Date:  07/17/2019  Patient ID:  Joshua, Arroyo Jun 05, 1941, MRN [id number] PCP:  Maury Dus, MD  Cardiologist:  Dr. Lovena Le   Chief Complaint: annual visit  History of Present Illness: Joshua Arroyo is a 78 y.o. male with history of AFlutter ablated, developed A tach (originating from [place] by EGD per Dr. Tanna Furry note) managed with Sotalol, HTN, HLD.   I saw him in 2018, he was feeling very well.  Asked about perhaps stopping some medicines to try and reduce the number.  He reported no concerning side effects.  He had not had any arrhythmia that he has been aware of, no CP, palpitations or SOB, no dizziness, near syncope or syncope.  No exertional incapacities.  Worked about the yard/house, no formal exercise, no difficulties with ADLs.  He reported labs done annually with his PMD,last was in January, no reported abnormalities. He was noted to have SB 47 (known for him) and asymptomatic, stable EKG intervals, no changes were made to his medicines.   Most recently, saw Dr. 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  Planned to continue sotalol as long as HR maintained 50 or better.  He discussed importance of prevention of tic bites, encouraged him to survey his skin and obtain some permethrin treated clothing to wear when working in the yard.  No changes were made to his therapy.   He is doing pretty well.  Has a bad L knee, states that he was to have had surgery early in the year though delayed 2/2 COVID.  He did get an injection fairly recently and this has helped his discomfort significantly, but would like to get the surgery behind him and will need our clearance.  Timing looks mid October currently.  He mentions his PMD was concerned that his pulse/HR was  so slow and asked he mentioned to [place].  Despite his knee he is still very active.  No CP, palpitations, he denies any exertional incapacities, no unusual fatigue.  He has had some infrequent dizzy spells that he has just attributed to aging.  No near syncope or syncope.  RCRI is 0.4 DUKE score is 58.2, 9.89METS  Past Medical History:  Diagnosis Date   Atrial flutter ([place])    post ablation   BPH (benign prostatic hyperplasia)    Hypercholesteremia    Hypertension     Past Surgical History:  Procedure Laterality Date   CARDIOVERSION     CERVICAL SPINE SURGERY  2011    Current Outpatient Medications  Medication Sig Dispense Refill   aspirin 81 MG tablet Take 81 mg by mouth daily.     DiphenhydrAMINE HCl, Sleep, (NIGHTTIME SLEEP AID) 50 MG tablet Take 50 mg by mouth at bedtime as needed for sleep.     doxycycline (VIBRA-TABS) 100 MG tablet Take 1 tablet (100 mg total) by mouth 2 (two) times daily. 20 tablet 0   FIBER PO Take 2 scoop by mouth daily as needed (fiber).     finasteride (PROSCAR) 5 MG tablet Take 5 mg by mouth daily.  0   furosemide (LASIX) 20 MG tablet Take 1 tablet by mouth daily.  0   losartan-hydrochlorothiazide (HYZAAR) 100-12.5 MG per tablet Take 1 tablet by mouth every morning.  0   MAGNESIUM PO Take  2 tablets by mouth daily as needed (magnesium supplement).     Multiple Vitamins-Minerals (ONE DAILY MENS HEALTH PO) Take 1 capsule by mouth daily.      OMEGA-3 FATTY ACIDS PO Take 2 tablets by mouth in the morning and 1 tablet in the eveving     pravastatin (PRAVACHOL) 40 MG tablet Take 40 mg by mouth daily.     PROTEIN PO Take 1 scoop by mouth daily as needed (protein supplement).     sildenafil (VIAGRA) 100 MG tablet Take 100 mg by mouth daily as needed for erectile dysfunction.      sotalol (BETAPACE) 80 MG tablet Take 1 tablet (80 mg total) by mouth 2 (two) times daily. 180 tablet 3   tamsulosin (FLOMAX) 0.4 MG CAPS Take 0.4 mg by mouth daily.      vitamin B-12 (CYANOCOBALAMIN) 250 MCG tablet Take 250 mcg by mouth daily.     No current facility-administered medications for this visit.     Allergies:   Patient has no known allergies.   Social History:  The patient  reports that he has never smoked. He has never used smokeless tobacco. He reports current alcohol use. He reports that he does not use drugs.   Family History:  The patient's family history includes Heart Problems in his father.  ROS:  Please see the history of present illness.    All other systems are reviewed and otherwise negative.   PHYSICAL EXAM:  VS:  There were no vitals taken for this visit. BMI: There is no height or weight on file to calculate BMI. Well nourished, well developed, in no acute distress  HEENT: normocephalic, atraumatic  Neck: no JVD, carotid bruits or masses Cardiac: RRR; bradycardic, no significant murmurs, no rubs, or gallops Lungs: CTA b/l, no wheezing, rhonchi or rales  Abd: soft, nontender MS: no deformity or  atrophy Ext:  trace edema L more then R is appreciated today Skin: warm and dry, no rash Neuro:  No gross deficits appreciated Psych: euthymic mood, full affect    EKG:  Done today and reviewed by myself shows SB 46bpm, 1st degree AVBlock, PR is 264ms, QT is stable, QTc 480ms   05/18/08: TTE SUMMARY - Left ventricular ejection fraction was estimated , range being 55    % to 60 %. There were no left ventricular regional wall    motion abnormalities. - Aortic valve thickness was mildly increased. There was moderate    aortic valvular regurgitation. - There was a mild mitral valve prolapse involving the anterior    leaflet. There was mild mitral valvular regurgitation. - Interatrial septum intact. The left atrium was dilated. There was    no left atrial appendage thrombus identified.   Recent Labs: No results found for requested labs within last 8760 hours.  No results found for requested  labs within last 8760 hours.   CrCl cannot be calculated (Patient's most recent lab result is older than the maximum 21 days allowed.).   Wt Readings from Last 3 Encounters:  07/07/18 232 lb 6.4 oz (105.4 kg)  07/06/18 229 lb (103.9 kg)  07/05/18 230 lb (104.3 kg)     Other studies reviewed: Additional studies/records reviewed today include: summarized above  ASSESSMENT AND PLAN:  1. Atach, Hx of Fluttert ablated     Well controlled on low dose sotalol     QTc/EKG stable      2. Asymptomatic (?) bradycardia     He mentions infrequent dizzy spells, no  near syncope or syncope, no exertional incapacities     Dr. Lovena Le had mentioned at his last visit to continue sotalol if HR >50  In d/w the patient when he was put back on the sotalol after his ablation he was going through a significantly difficult personal time and thinks this may have contributed.   We will go ahead and wean him off the sotalol to take once daily for 3 days then stop   3. Edema    Dependent by his description, resolves over night    He thinks he may be off his amlodipine, he can not remember but sayshis PMD stopped one of his BP medicines back in jan   4. HTN     Looks OK, no changes  5. HLD    Not discussed today  6. Pre-op     Low risk surgery     Low risk score     Excellent DUKE activity score    Disposition: wean off the sotalol, I will see him back next week check on his HR.  Will fill out his surgical form next week.  He will bring an updated list of medicines.   Current medicines are reviewed at length with the patient today.  The patient did not have any concerns regarding medicines.  Haywood Lasso, PA-C 07/17/2019 8:52 AM     [facility] [place] [address] [phone] (office)  [phone] (fax)

## 2019-07-19 ENCOUNTER — Ambulatory Visit: Payer: Medicare HMO | Admitting: Physician Assistant

## 2019-07-19 ENCOUNTER — Ambulatory Visit (INDEPENDENT_AMBULATORY_CARE_PROVIDER_SITE_OTHER): Payer: Medicare HMO | Admitting: Physician Assistant

## 2019-07-19 ENCOUNTER — Encounter: Payer: Self-pay | Admitting: Physician Assistant

## 2019-07-19 ENCOUNTER — Other Ambulatory Visit: Payer: Self-pay

## 2019-07-19 VITALS — BP 122/72 | HR 46 | Ht 72.0 in | Wt 212.0 lb

## 2019-07-19 DIAGNOSIS — I10 Essential (primary) hypertension: Secondary | ICD-10-CM | POA: Diagnosis not present

## 2019-07-19 DIAGNOSIS — I471 Supraventricular tachycardia: Secondary | ICD-10-CM

## 2019-07-19 DIAGNOSIS — R609 Edema, unspecified: Secondary | ICD-10-CM

## 2019-07-19 DIAGNOSIS — R001 Bradycardia, unspecified: Secondary | ICD-10-CM

## 2019-07-19 MED ORDER — SOTALOL HCL 80 MG PO TABS: 80.0000 mg | ORAL_TABLET | Freq: Every day | ORAL | 3 refills | Status: DC

## 2019-07-19 NOTE — Patient Instructions (Addendum)
Medication Instructions:   START TAKING SOTALOL  80 MG ONCE A DAY  FOR 3 DAYS  THEN STOP   If you need a refill on your cardiac medications before your next appointment, please call your pharmacy.   Lab work: NONE ORDERED  TODAY   If you have labs (blood work) drawn today and your tests are completely normal, you will receive your results only by: Marland Kitchen MyChart Message (if you have MyChart) OR . A paper copy in the mail If you have any lab test that is abnormal or we need to change your treatment, we will call you to review the results.  Testing/Procedures: NONE ORDERED  TODAY    Follow-Up:   Any Other Special Instructions Will Be Listed Below (If Applicable).

## 2019-07-29 ENCOUNTER — Other Ambulatory Visit: Payer: Self-pay

## 2019-07-29 ENCOUNTER — Ambulatory Visit (INDEPENDENT_AMBULATORY_CARE_PROVIDER_SITE_OTHER): Payer: Medicare HMO | Admitting: Physician Assistant

## 2019-07-29 ENCOUNTER — Encounter: Payer: Self-pay | Admitting: Physician Assistant

## 2019-07-29 VITALS — BP 112/66 | HR 61 | Ht 72.0 in | Wt 209.0 lb

## 2019-07-29 DIAGNOSIS — I471 Supraventricular tachycardia: Secondary | ICD-10-CM

## 2019-07-29 DIAGNOSIS — Z01818 Encounter for other preprocedural examination: Secondary | ICD-10-CM

## 2019-07-29 DIAGNOSIS — I10 Essential (primary) hypertension: Secondary | ICD-10-CM

## 2019-07-29 NOTE — Progress Notes (Signed)
Cardiology Office Note Date:  07/29/2019  Patient ID:  Joshua Arroyo, Joshua Arroyo 1941-03-22, MRN [id number] PCP:  Maury Dus, MD  Cardiologist:  Dr. Lovena Le   Chief Complaint: annual visi  History of Present Illness: Joshua Arroyo is a 78 y.o. male with history of AFlutter ablated, developed A tach (originating from [place] by EGD per Dr. Tanna Furry note), HTN, HLD.   I saw him in 2018, he was feeling very well.  Asked about perhaps stopping some medicines to try and reduce the number.  He reported no concerning side effects.  He had not had any arrhythmia that he has been aware of, no CP, palpitations or SOB, no dizziness, near syncope or syncope.  No exertional incapacities.  Worked about the yard/house, no formal exercise, no difficulties with ADLs.  He reported labs done annually with his PMD,last was in January, no reported abnormalities. He was noted to have SB 47 (known for him) and asymptomatic, stable EKG intervals, no changes were made to his medicines.   He saw Dr. 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  Planned to continue sotalol as long as HR maintained 50 or better.  He discussed importance of prevention of tic bites, encouraged him to survey his skin and obtain some permethrin treated clothing to wear when working in the yard.  No changes were made to his therapy.   I saw him for pre-op/annual visit 07/19/2019, he was doing pretty well.  Reported a bad L knee, stated that he was to have had surgery early in the year though delayed 2/2 COVID.  He did get an injection fairly recently and this has helped his discomfort significantly, but would like to get the surgery behind him and will need our clearance.  Timing looks mid October currently.  He mentions his PMD was concerned that  his pulse/HR was so slow and asked he mention it to [place]. He denied any kind of CP, palpitations or SOB, no DOE, PND or orthopnea. He reported some infrequent dizzy spells that he has just attributed to aging.  No near syncope or syncope.  EKG noted SB at 46, given some reports of dizziness and Dr. Tanna Furry threshold to remain on sotalol, he was planned to wean off and planned to come back in to revisit hie HR and clearance. Spoke about his remote At post flutter ablation, though the patient recalls that being an extraordinarily stressful time in his life as well and thinks this was more the trigger then anything.  Despite his knee he remains very active.  He continues to deny CP, palpitations, no exertional incapacities, no unusual fatigue.  No dizzy spells, no near syncope or syncope He brings home BP and HR readings all that look good  RCRI is 0.4 DUKE score is 58.2, 9.89METS  Past Medical History:  Diagnosis Date   Atrial flutter ([place])    post ablation   BPH (benign prostatic hyperplasia)    Hypercholesteremia    Hypertension     Past Surgical History:  Procedure Laterality Date   CARDIOVERSION     CERVICAL SPINE SURGERY  2011    Current Outpatient Medications  Medication Sig Dispense Refill   aspirin 81 MG tablet Take 81 mg by mouth daily.     clindamycin (CLEOCIN) 300 MG capsule Take  300 mg by mouth daily.     DiphenhydrAMINE HCl, Sleep, (NIGHTTIME SLEEP AID) 50 MG tablet Take 50 mg by mouth at bedtime as needed for sleep.     FIBER PO Take 2 scoop by mouth daily as needed (fiber).     finasteride (PROSCAR) 5 MG tablet Take 5 mg by mouth daily.  0   furosemide (LASIX) 20 MG tablet Take 1 tablet by mouth daily.  0   losartan-hydrochlorothiazide (HYZAAR) 100-12.5 MG per tablet Take 1 tablet by mouth every morning.  0   MAGNESIUM PO Take 2 tablets by mouth daily as needed (magnesium supplement).     Multiple Vitamins-Minerals (ONE DAILY MENS HEALTH PO) Take 1  capsule by mouth daily.      OMEGA-3 FATTY ACIDS PO Take 2 tablets by mouth in the morning and 1 tablet in the eveving     pravastatin (PRAVACHOL) 40 MG tablet Take 40 mg by mouth daily.     sildenafil (VIAGRA) 100 MG tablet Take 100 mg by mouth daily as needed for erectile dysfunction.      tamsulosin (FLOMAX) 0.4 MG CAPS Take 0.4 mg by mouth daily.     vitamin B-12 (CYANOCOBALAMIN) 250 MCG tablet Take 250 mcg by mouth daily.     No current facility-administered medications for this visit.     Allergies:   Patient has no known allergies.   Social History:  The patient  reports that he has never smoked. He has never used smokeless tobacco. He reports current alcohol use. He reports that he does not use drugs.   Family History:  The patient's family history includes Heart Problems in his father.  ROS:  Please see the history of present illness.    All other systems are reviewed and otherwise negative.   PHYSICAL EXAM:  VS:  BP 112/66    Pulse 61    Ht 6' (1.829 m)    Wt 209 lb (94.8 kg)    BMI 28.35 kg/m  BMI: Body mass index is 28.35 kg/m. Well nourished, well developed, in no acute distress  HEENT: normocephalic, atraumatic  Neck: no JVD, carotid bruits or masses Cardiac: RRR; bradycardic, no significant murmurs, no rubs, or gallops Lungs: CTA b/l, no wheezing, rhonchi or rales  Abd: soft, nontender MS: no deformity or  atrophy Ext:  traceif any edema noted Skin: warm and dry, no rash Neuro:  No gross deficits appreciated Psych: euthymic mood, full affect    EKG:  Done today and reviewed by myself shows SB 53bpm 07/19/2019 : SB 46bpm, 1st degree AVBlock, PR is 293ms, QT is stable, QTc 436ms   05/18/08: TTE SUMMARY - Left ventricular ejection fraction was estimated , range being 55    % to 60 %. There were no left ventricular regional wall    motion abnormalities. - Aortic valve thickness was mildly increased. There was moderate    aortic valvular  regurgitation. - There was a mild mitral valve prolapse involving the anterior    leaflet. There was mild mitral valvular regurgitation. - Interatrial septum intact. The left atrium was dilated. There was    no left atrial appendage thrombus identified.   Recent Labs: No results found for requested labs within last 8760 hours.  No results found for requested labs within last 8760 hours.   CrCl cannot be calculated (Patient's most recent lab result is older than the maximum 21 days allowed.).   Wt Readings from Last 3 Encounters:  07/29/19 209 lb (94.8  kg)  07/19/19 212 lb (96.2 kg)  07/07/18 232 lb 6.4 oz (105.4 kg)     Other studies reviewed: Additional studies/records reviewed today include: summarized above  ASSESSMENT AND PLAN:  1. Atach, Hx of Fluttert ablated     Off Sotalol 2/2 bradycardia     HR better      2. Edema    Not particularly noted today     Felt to be dependent by his description, resolves over night     4. HTN     Looks OK, no changes  5. HLD    Not discussed today  6. Pre-op     Low risk surgery     Low cardiac risk score     Excellent DUKE activity score     No cardiac contraindications to his knee surgery    Disposition:  Will see him back in 1 year, sooner if needed   Current medicines are reviewed at length with the patient today.  The patient did not have any concerns regarding medicines.  Haywood Lasso, PA-C 07/29/2019 3:55 PM     [place] [address] [phone] (office)  [phone] (fax)

## 2019-07-29 NOTE — Patient Instructions (Signed)
Medication Instructions:  Your physician recommends that you continue on your current medications as directed. Please refer to the Current Medication list given to you today.  If you need a refill on your cardiac medications before your next appointment, please call your pharmacy.   Lab work: NONE ORDERED  TODAY   If you have labs (blood work) drawn today and your tests are completely normal, you will receive your results only by: . MyChart Message (if you have MyChart) OR . A paper copy in the mail If you have any lab test that is abnormal or we need to change your treatment, we will call you to review the results.  Testing/Procedures: NONE ORDERED  TODAY  Follow-Up: At [facility], you and your health needs are our priority.  As part of our continuing mission to provide you with exceptional heart care, we have created designated Provider Care Teams.  These Care Teams include your primary Cardiologist (physician) and Advanced Practice Providers (APPs -  Physician Assistants and Nurse Practitioners) who all work together to provide you with the care you need, when you need it. You will need a follow up appointment in 1 years.  Please call our office 2 months in advance to schedule this appointment.  You may see Dr Taylor  or one of the following Advanced Practice Providers on your designated Care Team:   Amber Seiler, NP . Renee Ursuy, PA-C  Any Other Special Instructions Will Be Listed Below (If Applicable).    

## 2019-08-11 NOTE — Addendum Note (Signed)
Addended by: Claude Manges on: 08/11/2019 12:24 PM   Modules accepted: Orders

## 2019-08-23 DIAGNOSIS — N400 Enlarged prostate without lower urinary tract symptoms: Secondary | ICD-10-CM | POA: Diagnosis not present

## 2019-08-23 DIAGNOSIS — E7800 Pure hypercholesterolemia, unspecified: Secondary | ICD-10-CM | POA: Diagnosis not present

## 2019-08-23 DIAGNOSIS — I10 Essential (primary) hypertension: Secondary | ICD-10-CM | POA: Diagnosis not present

## 2019-08-24 DIAGNOSIS — I498 Other specified cardiac arrhythmias: Secondary | ICD-10-CM | POA: Diagnosis not present

## 2019-08-24 DIAGNOSIS — Z20828 Contact with and (suspected) exposure to other viral communicable diseases: Secondary | ICD-10-CM | POA: Diagnosis not present

## 2019-09-05 DIAGNOSIS — R69 Illness, unspecified: Secondary | ICD-10-CM | POA: Diagnosis not present

## 2019-09-11 DIAGNOSIS — R69 Illness, unspecified: Secondary | ICD-10-CM | POA: Diagnosis not present

## 2019-09-13 DIAGNOSIS — N400 Enlarged prostate without lower urinary tract symptoms: Secondary | ICD-10-CM | POA: Diagnosis not present

## 2019-09-13 DIAGNOSIS — Z820 Family history of epilepsy and other diseases of the nervous system: Secondary | ICD-10-CM | POA: Diagnosis not present

## 2019-09-13 DIAGNOSIS — Z008 Encounter for other general examination: Secondary | ICD-10-CM | POA: Diagnosis not present

## 2019-09-13 DIAGNOSIS — G4700 Insomnia, unspecified: Secondary | ICD-10-CM | POA: Diagnosis not present

## 2019-09-13 DIAGNOSIS — G629 Polyneuropathy, unspecified: Secondary | ICD-10-CM | POA: Diagnosis not present

## 2019-09-13 DIAGNOSIS — E785 Hyperlipidemia, unspecified: Secondary | ICD-10-CM | POA: Diagnosis not present

## 2019-09-13 DIAGNOSIS — R609 Edema, unspecified: Secondary | ICD-10-CM | POA: Diagnosis not present

## 2019-09-13 DIAGNOSIS — N529 Male erectile dysfunction, unspecified: Secondary | ICD-10-CM | POA: Diagnosis not present

## 2019-09-13 DIAGNOSIS — I10 Essential (primary) hypertension: Secondary | ICD-10-CM | POA: Diagnosis not present

## 2019-09-13 DIAGNOSIS — M1990 Unspecified osteoarthritis, unspecified site: Secondary | ICD-10-CM | POA: Diagnosis not present

## 2019-09-13 DIAGNOSIS — Z7982 Long term (current) use of aspirin: Secondary | ICD-10-CM | POA: Diagnosis not present

## 2019-09-27 DIAGNOSIS — E7800 Pure hypercholesterolemia, unspecified: Secondary | ICD-10-CM | POA: Diagnosis not present

## 2019-09-27 DIAGNOSIS — G629 Polyneuropathy, unspecified: Secondary | ICD-10-CM | POA: Diagnosis not present

## 2019-09-27 DIAGNOSIS — Z01818 Encounter for other preprocedural examination: Secondary | ICD-10-CM | POA: Diagnosis not present

## 2019-09-27 DIAGNOSIS — M1712 Unilateral primary osteoarthritis, left knee: Secondary | ICD-10-CM | POA: Diagnosis not present

## 2019-09-27 DIAGNOSIS — R7303 Prediabetes: Secondary | ICD-10-CM | POA: Diagnosis not present

## 2019-09-27 DIAGNOSIS — I10 Essential (primary) hypertension: Secondary | ICD-10-CM | POA: Diagnosis not present

## 2019-09-27 DIAGNOSIS — M2550 Pain in unspecified joint: Secondary | ICD-10-CM | POA: Diagnosis not present

## 2019-09-30 DIAGNOSIS — D72810 Lymphocytopenia: Secondary | ICD-10-CM | POA: Diagnosis not present

## 2019-10-13 DIAGNOSIS — G8929 Other chronic pain: Secondary | ICD-10-CM | POA: Diagnosis not present

## 2019-10-13 DIAGNOSIS — Z9889 Other specified postprocedural states: Secondary | ICD-10-CM | POA: Diagnosis not present

## 2019-10-13 DIAGNOSIS — M25562 Pain in left knee: Secondary | ICD-10-CM | POA: Diagnosis not present

## 2019-10-13 DIAGNOSIS — M1712 Unilateral primary osteoarthritis, left knee: Secondary | ICD-10-CM | POA: Diagnosis not present

## 2019-11-13 DIAGNOSIS — R69 Illness, unspecified: Secondary | ICD-10-CM | POA: Diagnosis not present

## 2019-11-16 DIAGNOSIS — M25561 Pain in right knee: Secondary | ICD-10-CM | POA: Diagnosis not present

## 2019-11-16 DIAGNOSIS — G8929 Other chronic pain: Secondary | ICD-10-CM | POA: Diagnosis not present

## 2019-11-23 DIAGNOSIS — E7800 Pure hypercholesterolemia, unspecified: Secondary | ICD-10-CM | POA: Diagnosis not present

## 2019-11-23 DIAGNOSIS — Z01818 Encounter for other preprocedural examination: Secondary | ICD-10-CM | POA: Diagnosis not present

## 2019-11-24 DIAGNOSIS — M1712 Unilateral primary osteoarthritis, left knee: Secondary | ICD-10-CM | POA: Diagnosis not present

## 2019-11-24 DIAGNOSIS — I10 Essential (primary) hypertension: Secondary | ICD-10-CM | POA: Diagnosis not present

## 2019-11-24 DIAGNOSIS — E7800 Pure hypercholesterolemia, unspecified: Secondary | ICD-10-CM | POA: Diagnosis not present

## 2019-11-24 DIAGNOSIS — N400 Enlarged prostate without lower urinary tract symptoms: Secondary | ICD-10-CM | POA: Diagnosis not present

## 2019-11-30 DIAGNOSIS — N529 Male erectile dysfunction, unspecified: Secondary | ICD-10-CM | POA: Diagnosis not present

## 2019-11-30 DIAGNOSIS — N400 Enlarged prostate without lower urinary tract symptoms: Secondary | ICD-10-CM | POA: Diagnosis not present

## 2019-11-30 DIAGNOSIS — E7800 Pure hypercholesterolemia, unspecified: Secondary | ICD-10-CM | POA: Diagnosis not present

## 2019-11-30 DIAGNOSIS — G4700 Insomnia, unspecified: Secondary | ICD-10-CM | POA: Diagnosis not present

## 2019-11-30 DIAGNOSIS — I10 Essential (primary) hypertension: Secondary | ICD-10-CM | POA: Diagnosis not present

## 2019-11-30 DIAGNOSIS — I4892 Unspecified atrial flutter: Secondary | ICD-10-CM | POA: Diagnosis not present

## 2019-11-30 DIAGNOSIS — L219 Seborrheic dermatitis, unspecified: Secondary | ICD-10-CM | POA: Diagnosis not present

## 2019-11-30 DIAGNOSIS — Z0000 Encounter for general adult medical examination without abnormal findings: Secondary | ICD-10-CM | POA: Diagnosis not present

## 2019-11-30 DIAGNOSIS — R7303 Prediabetes: Secondary | ICD-10-CM | POA: Diagnosis not present

## 2019-11-30 DIAGNOSIS — R609 Edema, unspecified: Secondary | ICD-10-CM | POA: Diagnosis not present

## 2019-12-05 DIAGNOSIS — G8918 Other acute postprocedural pain: Secondary | ICD-10-CM | POA: Diagnosis not present

## 2019-12-05 DIAGNOSIS — M1712 Unilateral primary osteoarthritis, left knee: Secondary | ICD-10-CM | POA: Diagnosis not present

## 2019-12-06 DIAGNOSIS — M1712 Unilateral primary osteoarthritis, left knee: Secondary | ICD-10-CM | POA: Diagnosis not present

## 2019-12-06 DIAGNOSIS — Z96652 Presence of left artificial knee joint: Secondary | ICD-10-CM | POA: Diagnosis not present

## 2019-12-12 ENCOUNTER — Emergency Department (HOSPITAL_COMMUNITY)
Admission: EM | Admit: 2019-12-12 | Discharge: 2019-12-13 | Disposition: A | Discharge status: Home / Self Care | Payer: Medicare HMO | Attending: Emergency Medicine | Admitting: Emergency Medicine

## 2019-12-12 ENCOUNTER — Other Ambulatory Visit: Payer: Self-pay

## 2019-12-12 DIAGNOSIS — Z7982 Long term (current) use of aspirin: Secondary | ICD-10-CM | POA: Insufficient documentation

## 2019-12-12 DIAGNOSIS — R2242 Localized swelling, mass and lump, left lower limb: Secondary | ICD-10-CM | POA: Diagnosis not present

## 2019-12-12 DIAGNOSIS — Z79899 Other long term (current) drug therapy: Secondary | ICD-10-CM | POA: Insufficient documentation

## 2019-12-12 DIAGNOSIS — I10 Essential (primary) hypertension: Secondary | ICD-10-CM | POA: Insufficient documentation

## 2019-12-12 DIAGNOSIS — M25662 Stiffness of left knee, not elsewhere classified: Secondary | ICD-10-CM | POA: Diagnosis not present

## 2019-12-12 DIAGNOSIS — Z96652 Presence of left artificial knee joint: Secondary | ICD-10-CM | POA: Diagnosis not present

## 2019-12-12 DIAGNOSIS — M7989 Other specified soft tissue disorders: Secondary | ICD-10-CM

## 2019-12-12 DIAGNOSIS — M25562 Pain in left knee: Secondary | ICD-10-CM | POA: Diagnosis not present

## 2019-12-12 DIAGNOSIS — Z9889 Other specified postprocedural states: Secondary | ICD-10-CM | POA: Diagnosis present

## 2019-12-12 LAB — CBC WITH DIFFERENTIAL/PLATELET
Abs Immature Granulocytes: 0.01 10*3/uL (ref 0.00–0.07)
Basophils Absolute: 0 10*3/uL (ref 0.0–0.1)
Basophils Relative: 1 %
Eosinophils Absolute: 0.1 10*3/uL (ref 0.0–0.5)
Eosinophils Relative: 3 %
HCT: 40.1 % (ref 39.0–52.0)
Hemoglobin: 13 g/dL (ref 13.0–17.0)
Immature Granulocytes: 0 %
Lymphocytes Relative: 16 %
Lymphs Abs: 0.7 10*3/uL (ref 0.7–4.0)
MCH: 30.3 pg (ref 26.0–34.0)
MCHC: 32.4 g/dL (ref 30.0–36.0)
MCV: 93.5 fL (ref 80.0–100.0)
Monocytes Absolute: 0.5 10*3/uL (ref 0.1–1.0)
Monocytes Relative: 10 %
Neutro Abs: 3.2 10*3/uL (ref 1.7–7.7)
Neutrophils Relative %: 70 %
Platelets: 229 10*3/uL (ref 150–400)
RBC: 4.29 MIL/uL (ref 4.22–5.81)
RDW: 14.3 % (ref 11.5–15.5)
WBC: 4.6 10*3/uL (ref 4.0–10.5)
nRBC: 0 % (ref 0.0–0.2)

## 2019-12-12 LAB — COMPREHENSIVE METABOLIC PANEL
ALT: 24 U/L (ref 0–44)
AST: 33 U/L (ref 15–41)
Albumin: 3.2 g/dL — ABNORMAL LOW (ref 3.5–5.0)
Alkaline Phosphatase: 52 U/L (ref 38–126)
Anion gap: 10 (ref 5–15)
BUN: 15 mg/dL (ref 8–23)
CO2: 27 mmol/L (ref 22–32)
Calcium: 8.7 mg/dL — ABNORMAL LOW (ref 8.9–10.3)
Chloride: 103 mmol/L (ref 98–111)
Creatinine, Ser: 0.96 mg/dL (ref 0.61–1.24)
GFR calc Af Amer: >60 mL/min (ref >60)
GFR calc non Af Amer: >60 mL/min (ref >60)
Glucose, Bld: 136 mg/dL — ABNORMAL HIGH (ref 70–99)
Potassium: 4.2 mmol/L (ref 3.5–5.1)
Sodium: 140 mmol/L (ref 135–145)
Total Bilirubin: 1.1 mg/dL (ref 0.3–1.2)
Total Protein: 6.5 g/dL (ref 6.5–8.1)

## 2019-12-12 LAB — LACTIC ACID, PLASMA: Lactic Acid, Venous: 1.1 mmol/L (ref 0.5–1.9)

## 2019-12-12 MED ORDER — SODIUM CHLORIDE 0.9% FLUSH: 3.0000 mL | Freq: Once | INTRAVENOUS | Status: DC

## 2019-12-12 MED ORDER — CEFTRIAXONE SODIUM 1 G IJ SOLR
1.0000 g | Freq: Once | INTRAMUSCULAR | Status: AC
  Administered 2019-12-13: 1 g via INTRAMUSCULAR
  Filled 2019-12-12: qty 10

## 2019-12-12 MED ORDER — ENOXAPARIN SODIUM 100 MG/ML ~~LOC~~ SOLN
1.0000 mg/kg | Freq: Once | SUBCUTANEOUS | Status: AC
  Administered 2019-12-13: 01:00:00 95 mg via SUBCUTANEOUS
  Filled 2019-12-12: qty 0.95

## 2019-12-12 NOTE — ED Provider Notes (Signed)
[facility] EMERGENCY DEPARTMENT Provider Note  CSN: [id number] Arrival date & time: 12/12/19 1604  Chief Complaint(s) Post-op Problem  HPI Joshua Arroyo is a 79 y.o. male status post left knee replacement on January 11 who presents to the emergency department for left leg swelling and pain to the anterior portion of the left lower leg.  Patient reports that 3 days ago he began ambulating on it and going up and down the stairs as well as perform rehab.  Patient endorses increased swelling since then..  Denies any fevers or chills.  No significant redness or discharge noted to the incision site.  No prior history of DVTs or PEs.  Patient is currently taking 2 full dose strength aspirin.  He denies any falls or trauma.  No other physical complaints.  HPI  Past Medical History Past Medical History:  Diagnosis Date  . Atrial flutter ([place])    post ablation  . BPH (benign prostatic hyperplasia)   . Hypercholesteremia   . Hypertension    Patient Active Problem List   Diagnosis Date Noted  . Tick bite 06/22/2018  . Essential hypertension 04/05/2015  . Atrial tachycardia ([place]) 06/07/2013  . Mild cognitive impairment 05/17/2013  . Sleep disturbance, unspecified 05/17/2013   Home Medication(s) Prior to Admission medications   Medication Sig Start Date End Date Taking? Authorizing Provider  aspirin 81 MG tablet Take 81 mg by mouth daily.    [provider]  clindamycin (CLEOCIN) 300 MG capsule Take 300 mg by mouth daily. 07/28/19   [provider]  DiphenhydrAMINE HCl, Sleep, (NIGHTTIME SLEEP AID) 50 MG tablet Take 50 mg by mouth at bedtime as needed for sleep.    [provider]  FIBER PO Take 2 scoop by mouth daily as needed (fiber).    [provider]  finasteride (PROSCAR) 5 MG tablet Take 5 mg by mouth daily. 06/21/17   [provider]  furosemide (LASIX) 20 MG tablet Take 1 tablet by mouth daily. 04/10/16   [provider]  losartan-hydrochlorothiazide (HYZAAR) 100-12.5 MG per tablet Take 1 tablet by mouth every morning. 03/14/15   [provider]  MAGNESIUM PO Take 2 tablets by mouth daily as needed (magnesium supplement).    [provider]  Multiple Vitamins-Minerals (ONE DAILY MENS HEALTH PO) Take 1 capsule by mouth daily.     [provider]  OMEGA-3 FATTY ACIDS PO Take 2 tablets by mouth in the morning and 1 tablet in the eveving    [provider]  pravastatin (PRAVACHOL) 40 MG tablet Take 40 mg by mouth daily.    [provider]  sildenafil (VIAGRA) 100 MG tablet Take 100 mg by mouth daily as needed for erectile dysfunction.     [provider]  tamsulosin (FLOMAX) 0.4 MG CAPS Take 0.4 mg by mouth daily.    [provider]  vitamin B-12 (CYANOCOBALAMIN) 250 MCG tablet Take 250 mcg by mouth daily.    [provider]  Past Surgical History Past Surgical History:  Procedure Laterality Date  . CARDIOVERSION    . CERVICAL SPINE SURGERY  2011   Family History Family History  Problem Relation Age of Onset  . Heart Problems Father     Social History Social History   Tobacco Use  . Smoking status: Never Smoker  . Smokeless tobacco: Never Used  Substance Use Topics  . Alcohol use: Yes    Comment: Socially  . Drug use: No   Allergies Patient has no known allergies.  Review of Systems Review of Systems All other systems are reviewed and are negative for acute change except as noted in the HPI  Physical Exam Vital Signs  I have reviewed the triage vital signs BP 119/71 (BP Location: Right Arm)   Pulse 70   Temp 98.4 F (36.9 C) (Oral)   Resp 16   SpO2 100%   Physical Exam Vitals reviewed.  Constitutional:      General: He is not in acute distress.    Appearance: He is  well-developed. He is not diaphoretic.  HENT:     Head: Normocephalic and atraumatic.     Jaw: No trismus.     Right Ear: External ear normal.     Left Ear: External ear normal.     Nose: Nose normal.  Eyes:     General: No scleral icterus.    Conjunctiva/sclera: Conjunctivae normal.  Neck:     Trachea: Phonation normal.  Cardiovascular:     Rate and Rhythm: Normal rate and regular rhythm.  Pulmonary:     Effort: Pulmonary effort is normal. No respiratory distress.     Breath sounds: No stridor.  Abdominal:     General: There is no distension.  Musculoskeletal:        General: Normal range of motion.     Cervical back: Normal range of motion.       Legs:  Neurological:     Mental Status: He is alert and oriented to person, place, and time.  Psychiatric:        Behavior: Behavior normal.       ED Results and Treatments Labs (all labs ordered are listed, but only abnormal results are displayed) Labs Reviewed  COMPREHENSIVE METABOLIC PANEL - Abnormal; Notable for the following components:      Result Value   Glucose, Bld 136 (*)    Calcium 8.7 (*)    Albumin 3.2 (*)    All other components within normal limits  LACTIC ACID, PLASMA  CBC WITH DIFFERENTIAL/PLATELET  LACTIC ACID, PLASMA  URINALYSIS, ROUTINE W REFLEX MICROSCOPIC                                                                                                                         EKG  EKG Interpretation  Date/Time:    Ventricular Rate:    PR Interval:    QRS Duration:   QT Interval:    QTC Calculation:   R  Axis:     Text Interpretation:        Radiology No results found.  Pertinent labs & imaging results that were available during my care of the patient were reviewed by me and considered in my medical decision making (see chart for details).  Medications Ordered in ED Medications  sodium chloride flush (NS) 0.9 % injection 3 mL (has no administration in time range)  enoxaparin (LOVENOX)  injection 95 mg (has no administration in time range)  sterile water (preservative free) injection (has no administration in time range)  cefTRIAXone (ROCEPHIN) injection 1 g (1 g Intramuscular Given 12/13/19 0010)                                                                                                                                    Procedures Procedures  (including critical care time)  Medical Decision Making / ED Course I have reviewed the nursing notes for this encounter and the patient's prior records (if available in EHR or on provided paperwork).   Joshua Arroyo was evaluated in Emergency Department on 12/13/2019 for the symptoms described in the history of present illness. He was evaluated in the context of the global COVID-19 pandemic, which necessitated consideration that the patient might be at risk for infection with the SARS-CoV-2 virus that causes COVID-19. Institutional protocols and algorithms that pertain to the evaluation of patients at risk for COVID-19 are in a state of rapid change based on information released by regulatory bodies including the CDC and federal and state organizations. These policies and algorithms were followed during the patient's care in the ED.  Patient presents with left leg swelling and pain to the anterior aspect, status post knee replacement 1 week ago. Pain may be related to possible settling ecchymosis. No obvious signs of infection at this time and blood work is reassuring without leukocytosis.  Patient does have mild hyperemia but no overt erythema.  We will provided a single dose of IM Rocephin. Patient does not have evidence of septic arthritis. Given the edema, will need to rule out DVT.  At this time a night we do not have ultrasound and patient will need to return in the morning for the ultrasound.  He was given a treatment dose of Lovenox.  DVT ultrasound order placed.   Recommended he touch base with Dr. Ronnie Derby, his orthopedist,  in the morning to update them about his progress.  The patient appears reasonably screened and/or stabilized for discharge and I doubt any other medical condition or other [facility] requiring further screening, evaluation, or treatment in the ED at this time prior to discharge.  The patient is safe for discharge with strict return precautions.       Final Clinical Impression(s) / ED Diagnoses Final diagnoses:  Left leg swelling  Status post left knee replacement    The patient appears reasonably screened and/or stabilized for discharge and I doubt any  other medical condition or other [facility] requiring further screening, evaluation, or treatment in the ED at this time prior to discharge.  Disposition: Discharge  Condition: Good  I have discussed the results, Dx and Tx plan with the patient who expressed understanding and agree(s) with the plan. Discharge instructions discussed at great length. The patient was given strict return precautions who verbalized understanding of the instructions. No further questions at time of discharge.    ED Discharge Orders         Ordered    LE VENOUS     12/13/19 0012           Follow Up: Vickey Huger, MD [place] [id number] [phone]  Call        This chart was dictated using voice recognition software.  Despite best efforts to proofread,  errors can occur which can change the documentation meaning.   Fatima Blank, MD 12/13/19 [phone]

## 2019-12-12 NOTE — ED Triage Notes (Signed)
Pt had L knee replacement on 1/11, noticed swelling, redness, and tenderness to touch. Called Dr. Lorre Nick who advised him to come here for ultrasound. Endorses chills, denies n/v.

## 2019-12-13 ENCOUNTER — Ambulatory Visit (HOSPITAL_BASED_OUTPATIENT_CLINIC_OR_DEPARTMENT_OTHER)
Admission: RE | Admit: 2019-12-13 | Discharge: 2019-12-13 | Disposition: A | Discharge status: Home / Self Care | Payer: Medicare HMO | Source: Ambulatory Visit | Attending: Emergency Medicine | Admitting: Emergency Medicine

## 2019-12-13 DIAGNOSIS — M7989 Other specified soft tissue disorders: Secondary | ICD-10-CM

## 2019-12-13 MED ORDER — STERILE WATER FOR INJECTION IJ SOLN
INTRAMUSCULAR | Status: AC
  Filled 2019-12-13: qty 10

## 2019-12-13 NOTE — ED Notes (Signed)
Pt discharge education provided. Medication education provided. Pt verbalizes understanding. Pt A&Ox4 on discharge. Pt verbalizes understanding of follow up outpatient ultrasound tomorrow.

## 2019-12-15 DIAGNOSIS — Z96652 Presence of left artificial knee joint: Secondary | ICD-10-CM | POA: Diagnosis not present

## 2019-12-16 DIAGNOSIS — M25662 Stiffness of left knee, not elsewhere classified: Secondary | ICD-10-CM | POA: Diagnosis not present

## 2019-12-16 DIAGNOSIS — M25562 Pain in left knee: Secondary | ICD-10-CM | POA: Diagnosis not present

## 2019-12-21 DIAGNOSIS — M25562 Pain in left knee: Secondary | ICD-10-CM | POA: Diagnosis not present

## 2019-12-21 DIAGNOSIS — M25669 Stiffness of unspecified knee, not elsewhere classified: Secondary | ICD-10-CM | POA: Diagnosis not present

## 2019-12-23 DIAGNOSIS — M25669 Stiffness of unspecified knee, not elsewhere classified: Secondary | ICD-10-CM | POA: Diagnosis not present

## 2019-12-23 DIAGNOSIS — M25562 Pain in left knee: Secondary | ICD-10-CM | POA: Diagnosis not present

## 2019-12-28 DIAGNOSIS — M25562 Pain in left knee: Secondary | ICD-10-CM | POA: Diagnosis not present

## 2019-12-28 DIAGNOSIS — M25669 Stiffness of unspecified knee, not elsewhere classified: Secondary | ICD-10-CM | POA: Diagnosis not present

## 2019-12-29 ENCOUNTER — Other Ambulatory Visit: Payer: Self-pay

## 2019-12-29 ENCOUNTER — Ambulatory Visit: Payer: Medicare HMO | Admitting: Internal Medicine

## 2019-12-29 ENCOUNTER — Telehealth: Payer: Self-pay | Admitting: Radiology

## 2019-12-29 ENCOUNTER — Telehealth: Payer: Self-pay | Admitting: Internal Medicine

## 2019-12-29 DIAGNOSIS — I10 Essential (primary) hypertension: Secondary | ICD-10-CM

## 2019-12-29 DIAGNOSIS — I471 Supraventricular tachycardia: Secondary | ICD-10-CM

## 2019-12-29 NOTE — Telephone Encounter (Signed)
Enrolled patient for a 14 day Zio AT monitor to be mailed to patients home.  

## 2019-12-29 NOTE — Progress Notes (Signed)
HPI [name] returns today after over a year absence from our arrhythmia clinic. He has a h/o atrial tachycardia which has been controlled. He underwent knee replacement surgery a couple of weeks ago and has felt poorly since then. The patient has chills and malaise, and a mild cough. No fever. He has had palpitations with HR's over a hundred. However, as his heart rate has come down, his symptoms have not improved. He denies fever.  No Known Allergies   Current Outpatient Medications  Medication Sig Dispense Refill  . amoxicillin (AMOXIL) 500 MG tablet Take 4 tablets by mouth as needed. Prior to dental appt    . aspirin 325 MG EC tablet Take 2 tablets by mouth daily.    . DiphenhydrAMINE HCl, Sleep, (NIGHTTIME SLEEP AID) 50 MG tablet Take 50 mg by mouth at bedtime as needed for sleep.    Marland Kitchen FIBER PO Take 2 scoop by mouth daily as needed (fiber).    . finasteride (PROSCAR) 5 MG tablet Take 5 mg by mouth daily.  0  . furosemide (LASIX) 20 MG tablet Take 1 tablet by mouth daily.  0  . losartan-hydrochlorothiazide (HYZAAR) 100-12.5 MG per tablet Take 1 tablet by mouth every morning.  0  . MAGNESIUM PO Take 2 tablets by mouth daily as needed (magnesium supplement).    . methocarbamol (ROBAXIN) 500 MG tablet Take 1 tablet by mouth as needed for pain.    . Multiple Vitamins-Minerals (ONE DAILY MENS HEALTH PO) Take 1 capsule by mouth daily.     . OMEGA-3 FATTY ACIDS PO Take 2 tablets by mouth in the morning and 1 tablet in the eveving    . ondansetron (ZOFRAN) 4 MG tablet Take 1 tablet by mouth as needed for nausea.    Marland Kitchen oxyCODONE (OXY IR/ROXICODONE) 5 MG immediate release tablet Take 1 tablet by mouth as needed for pain.    . pravastatin (PRAVACHOL) 40 MG tablet Take 40 mg by mouth daily.    . sildenafil (VIAGRA) 100 MG tablet Take 100 mg by mouth daily as needed for erectile dysfunction.     . tamsulosin (FLOMAX) 0.4 MG CAPS Take 0.4 mg by mouth daily.    . vitamin B-12 (CYANOCOBALAMIN)  250 MCG tablet Take 250 mcg by mouth daily.     No current facility-administered medications for this visit.     Past Medical History:  Diagnosis Date  . Atrial flutter ([place])    post ablation  . BPH (benign prostatic hyperplasia)   . Hypercholesteremia   . Hypertension     ROS:   All systems reviewed and negative except as noted in the HPI.   Past Surgical History:  Procedure Laterality Date  . CARDIOVERSION    . CERVICAL SPINE SURGERY  2011     Family History  Problem Relation Age of Onset  . Heart Problems Father      Social History   Socioeconomic History  . Marital status: Married    Spouse name: Tye [place]  . Number of children: 3  . Years of education: MA  . Highest education level: Not on file  Occupational History  . Not on file  Tobacco Use  . Smoking status: Never Smoker  . Smokeless tobacco: Never Used  Substance and Sexual Activity  . Alcohol use: Yes    Comment: Socially  . Drug use: No  . Sexual activity: Not on file  Other Topics Concern  . Not on file  Social History  Narrative   Pt lives at home with his spouse.   Caffeine Use: 1 cup of coffee daily   Social Determinants of Health   Financial Resource Strain:   . Difficulty of Paying Living Expenses: Not on file  Food Insecurity:   . Worried About Charity fundraiser in the Last Year: Not on file  . Ran Out of Food in the Last Year: Not on file  Transportation Needs:   . Lack of Transportation (Medical): Not on file  . Lack of Transportation (Non-Medical): Not on file  Physical Activity:   . Days of Exercise per Week: Not on file  . Minutes of Exercise per Session: Not on file  Stress:   . Feeling of Stress : Not on file  Social Connections:   . Frequency of Communication with Friends and Family: Not on file  . Frequency of Social Gatherings with Friends and Family: Not on file  . Attends Religious Services: Not on file  . Active Member of Clubs or Organizations: Not on file  .  Attends Archivist Meetings: Not on file  . Marital Status: Not on file  Intimate Partner Violence:   . Fear of Current or Ex-Partner: Not on file  . Emotionally Abused: Not on file  . Physically Abused: Not on file  . Sexually Abused: Not on file     BP - 110/68, P - 52, T - 97, R - 16 Physical Exam:  Well appearing 79 yo man, NAD HEENT: Unremarkable Neck:  No JVD, no thyromegally Lymphatics:  No adenopathy Back:  No CVA tenderness Lungs:  Clear HEART:  Regular rate rhythm, no murmurs, no rubs, no clicks Abd:  soft, positive bowel sounds, no organomegally, no rebound, no guarding Ext:  2 plus pulses, no edema, no cyanosis, no clubbing Skin:  No rashes no nodules Neuro:  CN II through XII intact, motor grossly intact  EKG - SInus brady  Assess/Plan: 1. Palpitations - he is at risk for atrial fib. I have asked him to wear a 14 day zio monitor. 2. Atrial tachycardia - he has not had any documented episodes. We will follow. 3. Malaise - I told him to get a Covid test if he does not feel better by Monday.  Mikle Bosworth.D.

## 2019-12-29 NOTE — Patient Instructions (Addendum)
Medication Instructions:  Your physician recommends that you continue on your current medications as directed. Please refer to the Current Medication list given to you today.  If you are not feeling better by Monday 01/02/2020 get a covid test  Labwork: None ordered.  Testing/Procedures: Your physician has recommended that you wear an event monitor. Event monitors are medical devices that record the heart's electrical activity. Doctors most often [place] these monitors to diagnose arrhythmias. Arrhythmias are problems with the speed or rhythm of the heartbeat. The monitor is a small, portable device. You can wear one while you do your normal daily activities. This is usually used to diagnose what is causing palpitations/syncope (passing out).  You will get a 14 day heart monitor.  Follow-Up: Your physician wants you to follow-up in: based on results of your heart monitor.  Any Other Special Instructions Will Be Listed Below (If Applicable).  If you need a refill on your cardiac medications before your next appointment, please call your pharmacy.   ZIO XT- Long Term Monitor Instructions   Your physician has requested you wear your ZIO patch monitor__14__days.   This is a single patch monitor.  [employer] supplies one patch monitor per enrollment.  Additional stickers are not available.   Please do not apply patch if you will be having a Nuclear Stress Test, Echocardiogram, Cardiac CT, MRI, or Chest Xray during the time frame you would be wearing the monitor. The patch cannot be worn during these tests.  You cannot remove and re-apply the ZIO XT patch monitor.   Your ZIO patch monitor will be sent USPS Priority mail from [facility] directly to your home address. The monitor may also be mailed to a PO BOX if home delivery is not available.   It may take 3-5 days to receive your monitor after you have been enrolled.   Once you have received you monitor, please review enclosed instructions.   Your monitor has already been registered assigning a specific monitor serial # to you.   Applying the monitor   Shave hair from upper left chest.   Hold abrader disc by orange tab.  Rub abrader in 40 strokes over left upper chest as indicated in your monitor instructions.   Clean area with 4 enclosed alcohol pads .  Use all pads to assure are is cleaned thoroughly.  Let dry.   Apply patch as indicated in monitor instructions.  Patch will be place under collarbone on left side of chest with arrow pointing upward.   Rub patch adhesive wings for 2 minutes.Remove white label marked "1".  Remove white label marked "2".  Rub patch adhesive wings for 2 additional minutes.   While looking in a mirror, press and release button in center of patch.  A small green light will flash 3-4 times .  This will be your only indicator the monitor has been turned on.     Do not shower for the first 24 hours.  You may shower after the first 24 hours.   Press button if you feel a symptom. You will hear a small click.  Record Date, Time and Symptom in the Patient Log Book.   When you are ready to remove patch, follow instructions on last 2 pages of Patient Log Book.  Stick patch monitor onto last page of Patient Log Book.   Place Patient Log Book in [place] box.  Use locking tab on box and tape box closed securely.  The [place] [address] box has  prepaid postage on it.  Please place in mailbox as soon as possible.  Your physician should have your test results approximately 7 days after the monitor has been mailed back to [facility].   Call [place] at [phone] if you have questions regarding your ZIO XT patch monitor.  Call them immediately if you see an orange light blinking on your monitor.   If your monitor falls off in less than 4 days contact our Monitor department at [phone].  If your monitor becomes loose or falls off after 4 days call [employer] at [phone] for suggestions on  securing your monitor.

## 2019-12-29 NOTE — Telephone Encounter (Signed)
I will send call to primary card nurse

## 2019-12-29 NOTE — Telephone Encounter (Signed)
New message   STAT if HR is under 50 or over 120 (normal HR is 60-100 beats per minute)  1) What is your heart rate? 90  2) Do you have a log of your heart rate readings (document readings)? Yes   3) Do you have any other symptoms?tires, nauseated, body aches   Please call the patient at the phone # that is listed on the contact information per his wife.

## 2019-12-29 NOTE — Telephone Encounter (Signed)
Lm on pt voicemail to call back and ask for me so I can assess his symptoms prior to sending to Dr. Lovena Le for review.

## 2019-12-29 NOTE — Telephone Encounter (Addendum)
Spoke with the pt and he has been off his Sotalol since this past summer but he took a dose this morning for his increase HR. He has been having nausea for a few weeks and chills but no fever the last couple of days. He is very concerned since his heart rate has been going up above 100 and then dropping down into the 50s. Pt available to come in to see Dr. Lovena Le today at 1:45 however will forward message to [facility] for review.           COVID-19 Pre-Screening Questions:  . In the past 7 to 10 days have you had a cough,  shortness of breath, headache, congestion, fever (100 or greater) body aches, chills, sore throat, or sudden loss of taste or sense of smell? Chills but no fever recently COVID tested by surgeon office  . Have you been around anyone with known Covid 19. NO . Have you been around anyone who is awaiting Covid 19 test results in the past 7 to 10 days? NO . Have you been around anyone who has been exposed to Covid 19, or has mentioned symptoms of Covid 19 within the past 7 to 10 days? NO  If you have any concerns/questions about symptoms patients report during screening (either on the phone or at threshold). Contact the provider seeing the patient or DOD for further guidance.  If neither are available contact a member of the leadership team.

## 2019-12-30 DIAGNOSIS — M25562 Pain in left knee: Secondary | ICD-10-CM | POA: Diagnosis not present

## 2019-12-30 DIAGNOSIS — M25662 Stiffness of left knee, not elsewhere classified: Secondary | ICD-10-CM | POA: Diagnosis not present

## 2020-01-02 ENCOUNTER — Emergency Department (HOSPITAL_COMMUNITY): Payer: Medicare HMO

## 2020-01-02 ENCOUNTER — Emergency Department (HOSPITAL_COMMUNITY)
Admission: EM | Admit: 2020-01-02 | Discharge: 2020-01-02 | Disposition: A | Discharge status: Home / Self Care | Payer: Medicare HMO | Attending: Emergency Medicine | Admitting: Emergency Medicine

## 2020-01-02 ENCOUNTER — Other Ambulatory Visit: Payer: Self-pay

## 2020-01-02 ENCOUNTER — Encounter (HOSPITAL_COMMUNITY): Payer: Self-pay | Admitting: Emergency Medicine

## 2020-01-02 DIAGNOSIS — Z5321 Procedure and treatment not carried out due to patient leaving prior to being seen by health care provider: Secondary | ICD-10-CM | POA: Diagnosis not present

## 2020-01-02 DIAGNOSIS — Z20828 Contact with and (suspected) exposure to other viral communicable diseases: Secondary | ICD-10-CM | POA: Diagnosis not present

## 2020-01-02 DIAGNOSIS — R6883 Chills (without fever): Secondary | ICD-10-CM | POA: Diagnosis not present

## 2020-01-02 DIAGNOSIS — R5383 Other fatigue: Secondary | ICD-10-CM | POA: Insufficient documentation

## 2020-01-02 DIAGNOSIS — R110 Nausea: Secondary | ICD-10-CM | POA: Diagnosis not present

## 2020-01-02 DIAGNOSIS — R531 Weakness: Secondary | ICD-10-CM | POA: Diagnosis not present

## 2020-01-02 LAB — COMPREHENSIVE METABOLIC PANEL
ALT: 17 U/L (ref 0–44)
AST: 19 U/L (ref 15–41)
Albumin: 3.6 g/dL (ref 3.5–5.0)
Alkaline Phosphatase: 56 U/L (ref 38–126)
Anion gap: 11 (ref 5–15)
BUN: 15 mg/dL (ref 8–23)
CO2: 24 mmol/L (ref 22–32)
Calcium: 8.6 mg/dL — ABNORMAL LOW (ref 8.9–10.3)
Chloride: 100 mmol/L (ref 98–111)
Creatinine, Ser: 1 mg/dL (ref 0.61–1.24)
GFR calc Af Amer: >60 mL/min (ref >60)
GFR calc non Af Amer: >60 mL/min (ref >60)
Glucose, Bld: 106 mg/dL — ABNORMAL HIGH (ref 70–99)
Potassium: 3.6 mmol/L (ref 3.5–5.1)
Sodium: 135 mmol/L (ref 135–145)
Total Bilirubin: 0.6 mg/dL (ref 0.3–1.2)
Total Protein: 6.5 g/dL (ref 6.5–8.1)

## 2020-01-02 LAB — CBC WITH DIFFERENTIAL/PLATELET
Abs Immature Granulocytes: 0.01 10*3/uL (ref 0.00–0.07)
Basophils Absolute: 0 10*3/uL (ref 0.0–0.1)
Basophils Relative: 0 %
Eosinophils Absolute: 0.2 10*3/uL (ref 0.0–0.5)
Eosinophils Relative: 4 %
HCT: 41.1 % (ref 39.0–52.0)
Hemoglobin: 13.1 g/dL (ref 13.0–17.0)
Immature Granulocytes: 0 %
Lymphocytes Relative: 29 %
Lymphs Abs: 1.3 10*3/uL (ref 0.7–4.0)
MCH: 30 pg (ref 26.0–34.0)
MCHC: 31.9 g/dL (ref 30.0–36.0)
MCV: 94.1 fL (ref 80.0–100.0)
Monocytes Absolute: 0.4 10*3/uL (ref 0.1–1.0)
Monocytes Relative: 8 %
Neutro Abs: 2.6 10*3/uL (ref 1.7–7.7)
Neutrophils Relative %: 59 %
Platelets: 181 10*3/uL (ref 150–400)
RBC: 4.37 MIL/uL (ref 4.22–5.81)
RDW: 14.4 % (ref 11.5–15.5)
WBC: 4.5 10*3/uL (ref 4.0–10.5)
nRBC: 0 % (ref 0.0–0.2)

## 2020-01-02 LAB — LACTIC ACID, PLASMA: Lactic Acid, Venous: 0.9 mmol/L (ref 0.5–1.9)

## 2020-01-02 MED ORDER — SODIUM CHLORIDE 0.9% FLUSH: 3.0000 mL | Freq: Once | INTRAVENOUS | Status: DC

## 2020-01-02 NOTE — ED Triage Notes (Signed)
Pt in POV, sent from [facility] for weakness, fatigue, rigors, nausea - concern for sepsis. R knee replacement 1/11, vascular study RLE 1/21 for R/O DVT - negative.

## 2020-01-02 NOTE — ED Notes (Signed)
Called pt for vitals, no answer x3.

## 2020-01-04 DIAGNOSIS — R6883 Chills (without fever): Secondary | ICD-10-CM | POA: Diagnosis not present

## 2020-01-04 DIAGNOSIS — R110 Nausea: Secondary | ICD-10-CM | POA: Diagnosis not present

## 2020-01-04 DIAGNOSIS — Z96652 Presence of left artificial knee joint: Secondary | ICD-10-CM | POA: Diagnosis not present

## 2020-01-06 DIAGNOSIS — R110 Nausea: Secondary | ICD-10-CM | POA: Diagnosis not present

## 2020-01-06 DIAGNOSIS — B349 Viral infection, unspecified: Secondary | ICD-10-CM | POA: Diagnosis not present

## 2020-01-06 DIAGNOSIS — M791 Myalgia, unspecified site: Secondary | ICD-10-CM | POA: Diagnosis not present

## 2020-01-06 DIAGNOSIS — R6883 Chills (without fever): Secondary | ICD-10-CM | POA: Diagnosis not present

## 2020-01-09 DIAGNOSIS — M25669 Stiffness of unspecified knee, not elsewhere classified: Secondary | ICD-10-CM | POA: Diagnosis not present

## 2020-01-09 DIAGNOSIS — M25562 Pain in left knee: Secondary | ICD-10-CM | POA: Diagnosis not present

## 2020-01-11 DIAGNOSIS — M25562 Pain in left knee: Secondary | ICD-10-CM | POA: Diagnosis not present

## 2020-01-16 DIAGNOSIS — M25662 Stiffness of left knee, not elsewhere classified: Secondary | ICD-10-CM | POA: Diagnosis not present

## 2020-01-30 ENCOUNTER — Encounter: Payer: Self-pay | Admitting: *Deleted

## 2020-01-30 NOTE — Progress Notes (Signed)
Patient ID: Joshua Arroyo, male   DOB: 10-28-1941, 79 y.o.   MRN: [id number] Theodore Demark was called inquiring about patients ZIO AT monitor results.  Patient has received his monitor but has yet to put it on.  He called [employer] stating he was very sick and would not be applying monitor until after 01/23/2020.  Patient has been instructed to call [employer] once he applies his monitor.

## 2020-02-29 DIAGNOSIS — H5211 Myopia, right eye: Secondary | ICD-10-CM | POA: Diagnosis not present

## 2020-03-27 DIAGNOSIS — M7989 Other specified soft tissue disorders: Secondary | ICD-10-CM | POA: Diagnosis not present

## 2020-03-27 DIAGNOSIS — Z96652 Presence of left artificial knee joint: Secondary | ICD-10-CM | POA: Diagnosis not present

## 2020-04-12 DIAGNOSIS — M1711 Unilateral primary osteoarthritis, right knee: Secondary | ICD-10-CM | POA: Diagnosis not present

## 2020-05-01 DIAGNOSIS — E7800 Pure hypercholesterolemia, unspecified: Secondary | ICD-10-CM | POA: Diagnosis not present

## 2020-05-01 DIAGNOSIS — I10 Essential (primary) hypertension: Secondary | ICD-10-CM | POA: Diagnosis not present

## 2020-05-01 DIAGNOSIS — G4700 Insomnia, unspecified: Secondary | ICD-10-CM | POA: Diagnosis not present

## 2020-05-01 DIAGNOSIS — M1712 Unilateral primary osteoarthritis, left knee: Secondary | ICD-10-CM | POA: Diagnosis not present

## 2020-05-01 DIAGNOSIS — N400 Enlarged prostate without lower urinary tract symptoms: Secondary | ICD-10-CM | POA: Diagnosis not present

## 2020-08-02 ENCOUNTER — Ambulatory Visit: Payer: Medicare HMO | Admitting: Internal Medicine

## 2020-08-02 ENCOUNTER — Other Ambulatory Visit: Payer: Self-pay

## 2020-08-02 ENCOUNTER — Encounter: Payer: Self-pay | Admitting: Internal Medicine

## 2020-08-02 VITALS — BP 122/84 | HR 56 | Ht 72.0 in | Wt 195.0 lb

## 2020-08-02 DIAGNOSIS — I471 Supraventricular tachycardia: Secondary | ICD-10-CM | POA: Diagnosis not present

## 2020-08-02 NOTE — Progress Notes (Signed)
HPI [name] returns today for ongoing evaluation and management of his atrial arrhythmias.  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  The patient underwent knee replacement surgery several months ago and was bothered postoperatively by pain and GI difficulties.  His GI problems resolved with discontinuation of narcotics.  He has had no symptomatic SVT since his last visit.  He denies chest pain or shortness of breath.  No syncope.  He does have occasional problems with pain in his knee replacement.  He denies trauma. No Known Allergies   Current Outpatient Medications  Medication Sig Dispense Refill  . amoxicillin (AMOXIL) 500 MG tablet Take 4 tablets by mouth as needed. Prior to dental appt    . aspirin 325 MG EC tablet Take 2 tablets by mouth daily.    . DiphenhydrAMINE HCl, Sleep, (NIGHTTIME SLEEP AID) 50 MG tablet Take 50 mg by mouth at bedtime as needed for sleep.    Marland Kitchen FIBER PO Take 2 scoop by mouth daily as needed (fiber).    . finasteride (PROSCAR) 5 MG tablet Take 5 mg by mouth daily.  0  . furosemide (LASIX) 20 MG tablet Take 1 tablet by mouth daily.  0  . losartan-hydrochlorothiazide (HYZAAR) 100-12.5 MG per tablet Take 1 tablet by mouth every morning.  0  . Multiple Vitamins-Minerals (ONE DAILY MENS HEALTH PO) Take 1 capsule by mouth daily.     . OMEGA-3 FATTY ACIDS PO Take 1 tablet by mouth daily. Take 2 tablets by mouth in the morning and 1 tablet in the eveving    . oxyCODONE (OXY IR/ROXICODONE) 5 MG immediate release tablet Take 1 tablet by mouth as needed for pain.    . pravastatin (PRAVACHOL) 40 MG tablet Take 40 mg by mouth daily.    . sildenafil (VIAGRA) 100 MG tablet Take 100 mg by mouth daily as needed for erectile dysfunction.     . tamsulosin (FLOMAX) 0.4 MG CAPS Take 0.4 mg by mouth daily.    . vitamin B-12 (CYANOCOBALAMIN) 250 MCG tablet Take 250 mcg by mouth daily.     No current  facility-administered medications for this visit.     Past Medical History:  Diagnosis Date  . Atrial flutter ([place])    post ablation  . BPH (benign prostatic hyperplasia)   . Hypercholesteremia   . Hypertension     ROS:   All systems reviewed and negative except as noted in the HPI.   Past Surgical History:  Procedure Laterality Date  . CARDIOVERSION    . CERVICAL SPINE SURGERY  2011     Family History  Problem Relation Age of Onset  . Heart Problems Father      Social History   Socioeconomic History  . Marital status: Married    Spouse name: Joshua Arroyo  . Number of children: 3  . Years of education: MA  . Highest education level: Not on file  Occupational History  . Not on file  Tobacco Use  . Smoking status: Never Smoker  . Smokeless tobacco: Never Used  Vaping Use  . Vaping Use: Never used  Substance and Sexual Activity  . Alcohol use: Yes    Comment: Socially  . Drug use: No  . Sexual activity: Not on file  Other Topics Concern  . Not on file  Social History Narrative   Pt lives at home with his spouse.   Caffeine Use:  1 cup of coffee daily   Social Determinants of Health   Financial Resource Strain:   . Difficulty of Paying Living Expenses: Not on file  Food Insecurity:   . Worried About Charity fundraiser in the Last Year: Not on file  . Ran Out of Food in the Last Year: Not on file  Transportation Needs:   . Lack of Transportation (Medical): Not on file  . Lack of Transportation (Non-Medical): Not on file  Physical Activity:   . Days of Exercise per Week: Not on file  . Minutes of Exercise per Session: Not on file  Stress:   . Feeling of Stress : Not on file  Social Connections:   . Frequency of Communication with Friends and Family: Not on file  . Frequency of Social Gatherings with Friends and Family: Not on file  . Attends Religious Services: Not on file  . Active Member of Clubs or Organizations: Not on file  . Attends Theatre manager Meetings: Not on file  . Marital Status: Not on file  Intimate Partner Violence:   . Fear of Current or Ex-Partner: Not on file  . Emotionally Abused: Not on file  . Physically Abused: Not on file  . Sexually Abused: Not on file     BP 122/84   Pulse (!) 56   Ht 6' (1.829 m)   Wt 195 lb (88.5 kg)   SpO2 96%   BMI 26.45 kg/m   Physical Exam:  Well appearing 79 year old man, NAD HEENT: Unremarkable Neck:  No JVD, no thyromegally Lymphatics:  No adenopathy Back:  No CVA tenderness Lungs:  Clear, with no wheezes, rales, or rhonchi HEART:  Regular rate rhythm, no murmurs, no rubs, no clicks Abd:  soft, positive bowel sounds, no organomegally, no rebound, no guarding Ext:  2 plus pulses, no edema, no cyanosis, no clubbing Skin:  No rashes no nodules Neuro:  CN II through XII intact, motor grossly intact  EKG -normal sinus rhythm   Assess/Plan: 1.  Atrial tachycardia -he has had no recurrent symptoms.  He will continue his current medical therapy 2.  Hypertension -his blood pressure is well controlled.  He is encouraged to maintain a low-sodium diet. 3.  Knee pain -I have encouraged him to contact his orthopedic surgeon.  Examination of the knee today demonstrates no obvious effusion, tenderness, erythema, or pain with motion  Cristopher Peru, MD

## 2020-08-02 NOTE — Patient Instructions (Signed)
Medication Instructions:  Your physician recommends that you continue on your current medications as directed. Please refer to the Current Medication list given to you today.  Labwork: None ordered.  Testing/Procedures: None ordered.  Follow-Up: Your physician wants you to follow-up in: as needed with Dr. Taylor.      Any Other Special Instructions Will Be Listed Below (If Applicable).  If you need a refill on your cardiac medications before your next appointment, please call your pharmacy.   

## 2020-08-09 DIAGNOSIS — N400 Enlarged prostate without lower urinary tract symptoms: Secondary | ICD-10-CM | POA: Diagnosis not present

## 2020-08-09 DIAGNOSIS — I10 Essential (primary) hypertension: Secondary | ICD-10-CM | POA: Diagnosis not present

## 2020-08-09 DIAGNOSIS — M1712 Unilateral primary osteoarthritis, left knee: Secondary | ICD-10-CM | POA: Diagnosis not present

## 2020-08-09 DIAGNOSIS — E7800 Pure hypercholesterolemia, unspecified: Secondary | ICD-10-CM | POA: Diagnosis not present

## 2020-08-09 DIAGNOSIS — G4700 Insomnia, unspecified: Secondary | ICD-10-CM | POA: Diagnosis not present

## 2020-08-23 DIAGNOSIS — N400 Enlarged prostate without lower urinary tract symptoms: Secondary | ICD-10-CM | POA: Diagnosis not present

## 2020-08-23 DIAGNOSIS — Z23 Encounter for immunization: Secondary | ICD-10-CM | POA: Diagnosis not present

## 2020-08-23 DIAGNOSIS — E7800 Pure hypercholesterolemia, unspecified: Secondary | ICD-10-CM | POA: Diagnosis not present

## 2020-08-23 DIAGNOSIS — G4700 Insomnia, unspecified: Secondary | ICD-10-CM | POA: Diagnosis not present

## 2020-08-23 DIAGNOSIS — R609 Edema, unspecified: Secondary | ICD-10-CM | POA: Diagnosis not present

## 2020-08-23 DIAGNOSIS — N529 Male erectile dysfunction, unspecified: Secondary | ICD-10-CM | POA: Diagnosis not present

## 2020-08-23 DIAGNOSIS — N390 Urinary tract infection, site not specified: Secondary | ICD-10-CM | POA: Diagnosis not present

## 2020-08-23 DIAGNOSIS — I10 Essential (primary) hypertension: Secondary | ICD-10-CM | POA: Diagnosis not present

## 2020-08-23 DIAGNOSIS — I4892 Unspecified atrial flutter: Secondary | ICD-10-CM | POA: Diagnosis not present

## 2020-08-23 DIAGNOSIS — R7309 Other abnormal glucose: Secondary | ICD-10-CM | POA: Diagnosis not present

## 2020-08-23 DIAGNOSIS — R300 Dysuria: Secondary | ICD-10-CM | POA: Diagnosis not present

## 2020-09-14 DIAGNOSIS — B354 Tinea corporis: Secondary | ICD-10-CM | POA: Diagnosis not present

## 2020-09-14 DIAGNOSIS — L723 Sebaceous cyst: Secondary | ICD-10-CM | POA: Diagnosis not present

## 2020-09-14 DIAGNOSIS — C44519 Basal cell carcinoma of skin of other part of trunk: Secondary | ICD-10-CM | POA: Diagnosis not present

## 2020-09-14 DIAGNOSIS — L720 Epidermal cyst: Secondary | ICD-10-CM | POA: Diagnosis not present

## 2020-09-14 DIAGNOSIS — L570 Actinic keratosis: Secondary | ICD-10-CM | POA: Diagnosis not present

## 2020-09-14 DIAGNOSIS — D485 Neoplasm of uncertain behavior of skin: Secondary | ICD-10-CM | POA: Diagnosis not present

## 2020-09-14 DIAGNOSIS — L820 Inflamed seborrheic keratosis: Secondary | ICD-10-CM | POA: Diagnosis not present

## 2020-09-17 DIAGNOSIS — N3000 Acute cystitis without hematuria: Secondary | ICD-10-CM | POA: Diagnosis not present

## 2020-10-16 DIAGNOSIS — L723 Sebaceous cyst: Secondary | ICD-10-CM | POA: Diagnosis not present

## 2020-10-16 DIAGNOSIS — L720 Epidermal cyst: Secondary | ICD-10-CM | POA: Diagnosis not present

## 2020-11-13 DIAGNOSIS — M1712 Unilateral primary osteoarthritis, left knee: Secondary | ICD-10-CM | POA: Diagnosis not present

## 2020-11-13 DIAGNOSIS — I10 Essential (primary) hypertension: Secondary | ICD-10-CM | POA: Diagnosis not present

## 2020-11-13 DIAGNOSIS — E7800 Pure hypercholesterolemia, unspecified: Secondary | ICD-10-CM | POA: Diagnosis not present

## 2020-11-13 DIAGNOSIS — N400 Enlarged prostate without lower urinary tract symptoms: Secondary | ICD-10-CM | POA: Diagnosis not present

## 2020-11-13 DIAGNOSIS — G4700 Insomnia, unspecified: Secondary | ICD-10-CM | POA: Diagnosis not present

## 2020-11-27 DIAGNOSIS — C44519 Basal cell carcinoma of skin of other part of trunk: Secondary | ICD-10-CM | POA: Diagnosis not present

## 2020-11-28 DIAGNOSIS — R3915 Urgency of urination: Secondary | ICD-10-CM | POA: Diagnosis not present

## 2020-11-28 DIAGNOSIS — N401 Enlarged prostate with lower urinary tract symptoms: Secondary | ICD-10-CM | POA: Diagnosis not present

## 2020-11-28 DIAGNOSIS — R310 Gross hematuria: Secondary | ICD-10-CM | POA: Diagnosis not present

## 2020-11-28 DIAGNOSIS — R8271 Bacteriuria: Secondary | ICD-10-CM | POA: Diagnosis not present

## 2020-11-28 DIAGNOSIS — R3912 Poor urinary stream: Secondary | ICD-10-CM | POA: Diagnosis not present

## 2020-12-06 DIAGNOSIS — I4892 Unspecified atrial flutter: Secondary | ICD-10-CM | POA: Diagnosis not present

## 2020-12-06 DIAGNOSIS — Z0000 Encounter for general adult medical examination without abnormal findings: Secondary | ICD-10-CM | POA: Diagnosis not present

## 2020-12-06 DIAGNOSIS — I10 Essential (primary) hypertension: Secondary | ICD-10-CM | POA: Diagnosis not present

## 2020-12-06 DIAGNOSIS — E7800 Pure hypercholesterolemia, unspecified: Secondary | ICD-10-CM | POA: Diagnosis not present

## 2020-12-06 DIAGNOSIS — L219 Seborrheic dermatitis, unspecified: Secondary | ICD-10-CM | POA: Diagnosis not present

## 2020-12-06 DIAGNOSIS — G4700 Insomnia, unspecified: Secondary | ICD-10-CM | POA: Diagnosis not present

## 2020-12-06 DIAGNOSIS — R609 Edema, unspecified: Secondary | ICD-10-CM | POA: Diagnosis not present

## 2020-12-06 DIAGNOSIS — Z1389 Encounter for screening for other disorder: Secondary | ICD-10-CM | POA: Diagnosis not present

## 2020-12-06 DIAGNOSIS — N400 Enlarged prostate without lower urinary tract symptoms: Secondary | ICD-10-CM | POA: Diagnosis not present

## 2020-12-06 DIAGNOSIS — Z125 Encounter for screening for malignant neoplasm of prostate: Secondary | ICD-10-CM | POA: Diagnosis not present

## 2020-12-06 DIAGNOSIS — R7309 Other abnormal glucose: Secondary | ICD-10-CM | POA: Diagnosis not present

## 2020-12-18 DIAGNOSIS — R310 Gross hematuria: Secondary | ICD-10-CM | POA: Diagnosis not present

## 2020-12-24 DIAGNOSIS — R300 Dysuria: Secondary | ICD-10-CM | POA: Diagnosis not present

## 2020-12-25 ENCOUNTER — Other Ambulatory Visit: Payer: Self-pay | Admitting: Urology

## 2020-12-25 DIAGNOSIS — R935 Abnormal findings on diagnostic imaging of other abdominal regions, including retroperitoneum: Secondary | ICD-10-CM

## 2021-01-03 ENCOUNTER — Ambulatory Visit (HOSPITAL_COMMUNITY)
Admission: RE | Admit: 2021-01-03 | Discharge: 2021-01-03 | Disposition: A | Discharge status: Home / Self Care | Payer: Medicare HMO | Source: Ambulatory Visit | Attending: Urology | Admitting: Urology

## 2021-01-03 ENCOUNTER — Other Ambulatory Visit: Payer: Self-pay

## 2021-01-03 ENCOUNTER — Other Ambulatory Visit: Payer: Self-pay | Admitting: Urology

## 2021-01-03 DIAGNOSIS — K862 Cyst of pancreas: Secondary | ICD-10-CM | POA: Diagnosis not present

## 2021-01-03 DIAGNOSIS — R935 Abnormal findings on diagnostic imaging of other abdominal regions, including retroperitoneum: Secondary | ICD-10-CM

## 2021-01-03 MED ORDER — GADOBUTROL 1 MMOL/ML IV SOLN
9.0000 mL | Freq: Once | INTRAVENOUS | Status: AC | PRN
  Administered 2021-01-03: 9 mL via INTRAVENOUS

## 2021-01-31 DIAGNOSIS — M1712 Unilateral primary osteoarthritis, left knee: Secondary | ICD-10-CM | POA: Diagnosis not present

## 2021-01-31 DIAGNOSIS — E7800 Pure hypercholesterolemia, unspecified: Secondary | ICD-10-CM | POA: Diagnosis not present

## 2021-01-31 DIAGNOSIS — N400 Enlarged prostate without lower urinary tract symptoms: Secondary | ICD-10-CM | POA: Diagnosis not present

## 2021-01-31 DIAGNOSIS — G4700 Insomnia, unspecified: Secondary | ICD-10-CM | POA: Diagnosis not present

## 2021-01-31 DIAGNOSIS — I10 Essential (primary) hypertension: Secondary | ICD-10-CM | POA: Diagnosis not present

## 2021-03-22 IMAGING — MR MR ABDOMEN WO/W CM
18 of 19 series · 46 of 48 positions shown · IV contrast (9.0 mL GADAVIST)
Comparison: CT on 12/18/2020

CLINICAL DATA: Cystic pancreatic lesion on recent CT.

EXAM:
MRI ABDOMEN WITHOUT AND WITH CONTRAST
TECHNIQUE: Multiplanar multisequence MR imaging of the abdomen was performed
both before and after the administration of intravenous contrast.
CONTRAST:  9mL GADAVIST GADOBUTROL 1 MMOL/ML IV SOLN

[Series 3: T2 · coronal · 7.0mm · 1.46mm/px · 2 of 29 slices shown (1 of 2)]
[im 1/29]
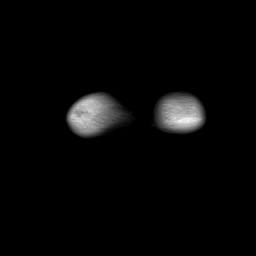
[im 29/29]
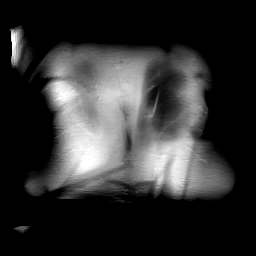

[Series 4: T2 fat-sat · axial · 6.0mm · 1.17mm/px · z∈[-117,+135]mm · 2 of 36 slices shown]
[im 1/36]
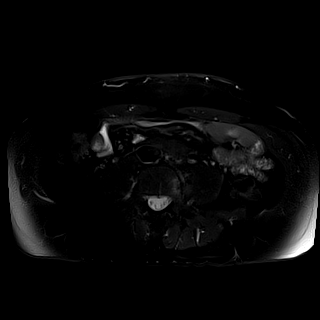
[im 36/36]
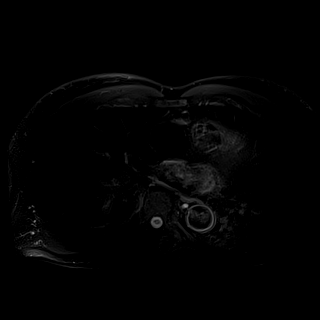

[Series 6: T1 · axial · 3.1mm · 1.17mm/px · z∈[-133,+137]mm · 3 of 88 slices shown (1 of 2)]
[im 1/88]
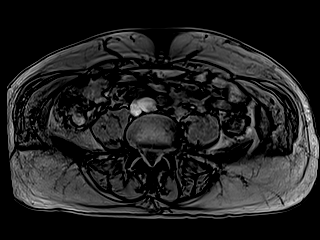
[im 44/88]
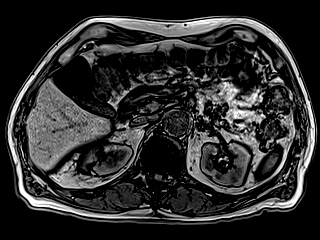
[im 88/88]
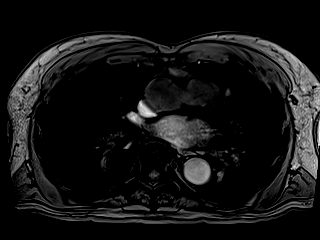

[Series 7: T1 · axial · 3.1mm · 1.17mm/px · z∈[-133,+137]mm · 3 of 88 slices shown (2 of 2)]
[im 1/88]
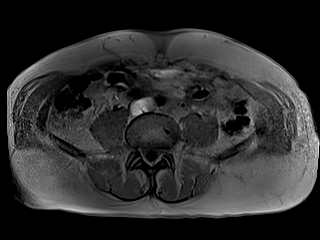
[im 44/88]
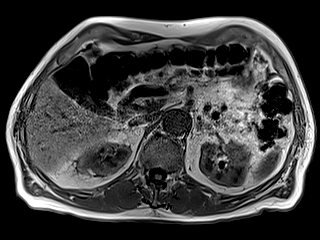
[im 88/88]
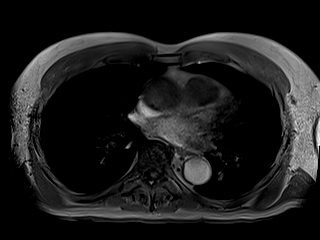

[Series 8: DWI · axial · 6.0mm · 1.36mm/px · z∈[-133,+140]mm · 3 of 78 slices shown (1 of 2)]
[im 1/78]
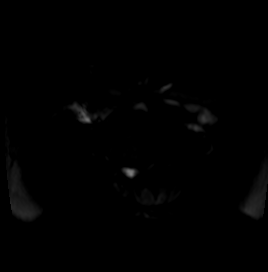
[im 39/78]
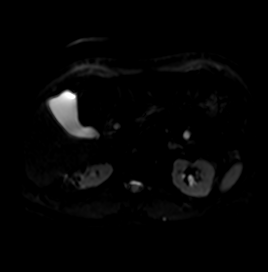
[im 78/78]
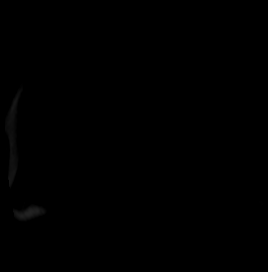

[Series 9: DWI · axial · 6.0mm · 1.36mm/px · 1 of 39 slices shown (2 of 2)]
[im 1/39]
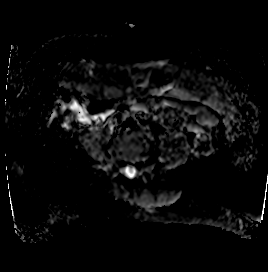

[Series 10: bSSFP · axial · 7.0mm · 1.17mm/px · 1 of 33 slices shown]
[im 1/33]
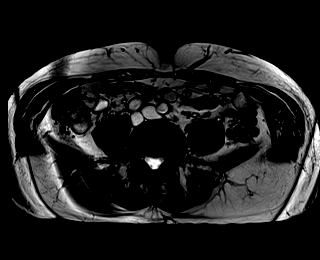

[Series 13: T1 dynamic · axial · 3.0mm · 1.17mm/px · z∈[-128,+133]mm · 3 of 88 slices shown (1 of 10)]
[im 1/88]
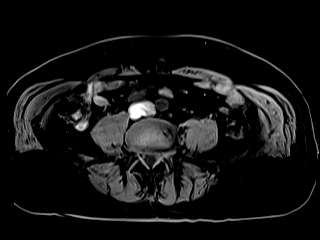
[im 44/88]
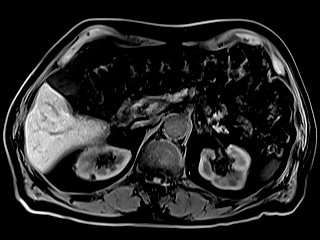
[im 88/88]
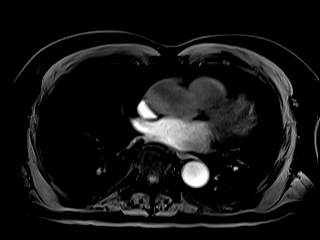

[Series 17: T1 dynamic · axial · 3.0mm · 1.17mm/px · z∈[-128,+133]mm · 3 of 88 slices shown (2 of 10)]
[im 1/88]
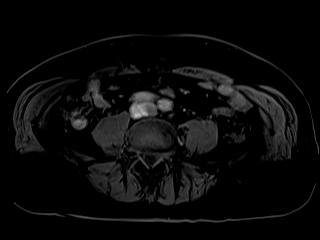
[im 44/88]
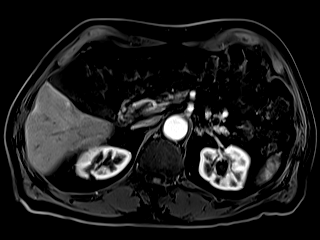
[im 88/88]
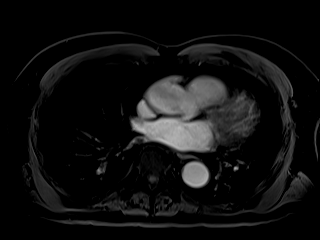

[Series 18: T1 dynamic · axial · 3.0mm · 1.17mm/px · z∈[-128,+133]mm · 3 of 88 slices shown (3 of 10)]
[im 1/88]
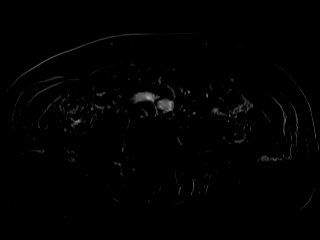
[im 44/88]
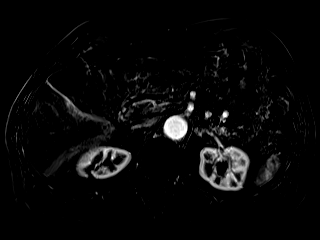
[im 88/88]
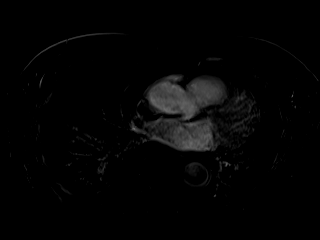

[Series 21: T1 dynamic · axial · 3.0mm · 1.17mm/px · z∈[-128,+133]mm · 3 of 88 slices shown (4 of 10)]
[im 1/88]
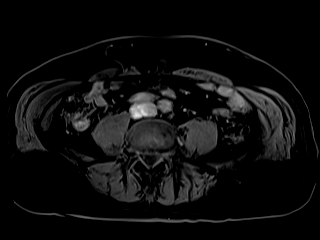
[im 44/88]
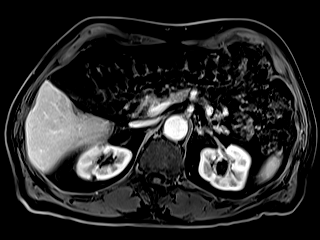
[im 88/88]
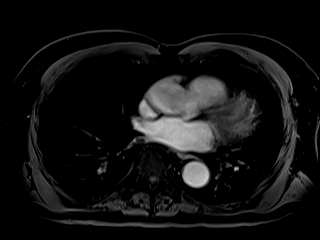

[Series 22: T1 dynamic · axial · 3.0mm · 1.17mm/px · z∈[-128,+133]mm · 3 of 88 slices shown (5 of 10)]
[im 1/88]
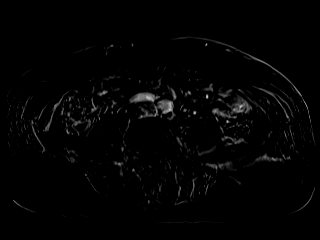
[im 44/88]
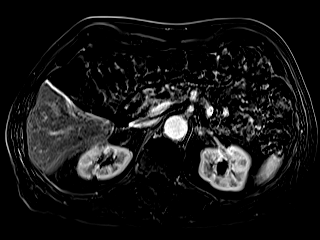
[im 88/88]
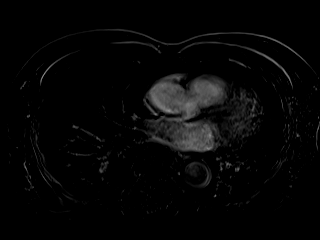

[Series 25: T1 dynamic · axial · 3.0mm · 1.17mm/px · z∈[-128,+133]mm · 3 of 88 slices shown (6 of 10)]
[im 1/88]
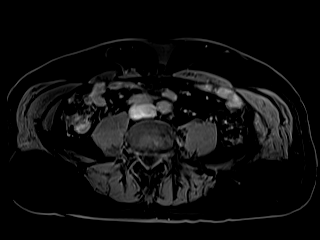
[im 44/88]
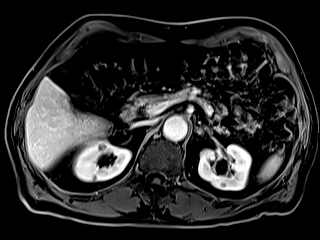
[im 88/88]
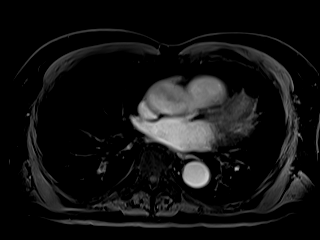

[Series 26: T1 dynamic · axial · 3.0mm · 1.17mm/px · z∈[-128,+133]mm · 3 of 88 slices shown (7 of 10)]
[im 1/88]
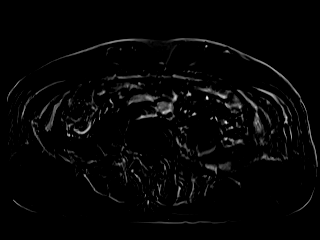
[im 44/88]
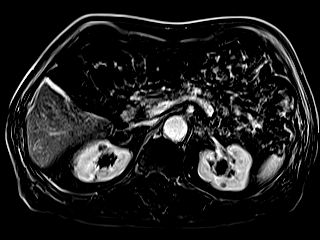
[im 88/88]
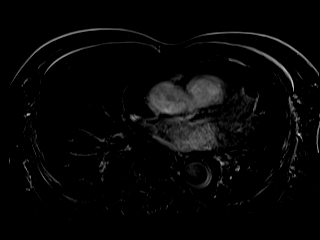

[Series 28: T1 dynamic · coronal · 3.0mm · 1.17mm/px · 3 of 80 slices shown (8 of 10)]
[im 1/80]
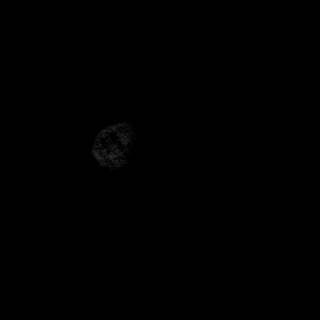
[im 40/80]
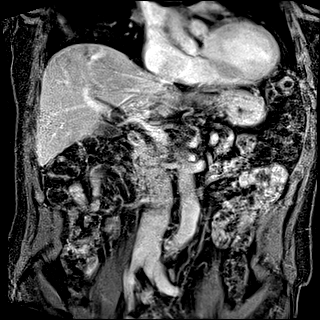
[im 80/80]
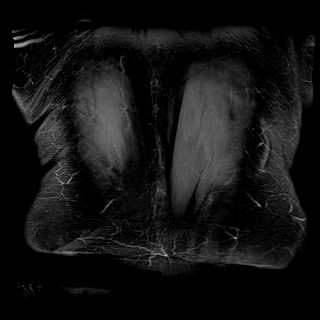

[Series 29: T2 · axial · 6.0mm · 1.46mm/px · 1 of 37 slices shown (2 of 2)]
[im 1/37]
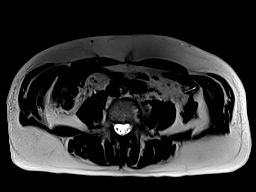

[Series 32: T1 dynamic · axial · 3.0mm · 1.17mm/px · z∈[-128,+133]mm · 3 of 88 slices shown (9 of 10)]
[im 1/88]
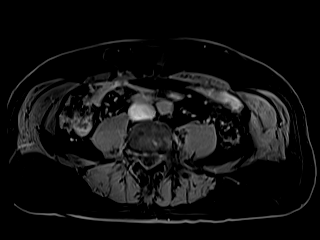
[im 44/88]
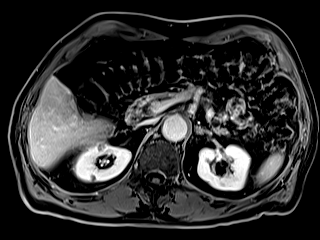
[im 88/88]
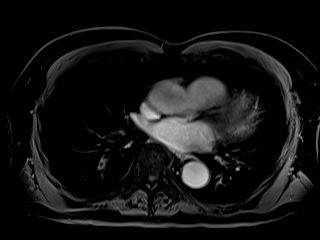

[Series 33: T1 dynamic · axial · 3.0mm · 1.17mm/px · z∈[-128,+133]mm · 3 of 88 slices shown (10 of 10)]
[im 1/88]
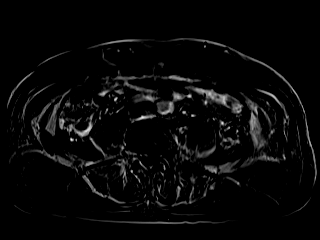
[im 44/88]
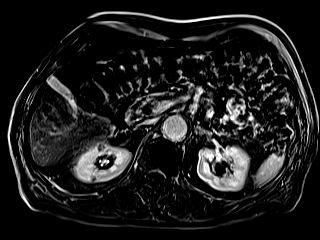
[im 88/88]
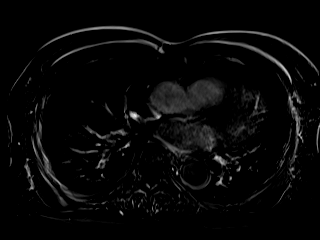

[46 of 48 positions shown; findings below may reference images not displayed]

FINDINGS: Lower chest: No acute findings.

Hepatobiliary: No hepatic masses identified. Gallbladder is
unremarkable. No evidence of biliary ductal dilatation.

Pancreas: A 13 mm simple appearing cystic lesion is seen in the
pancreatic tail on image [DATE]. A bilobed cyst is also seen in the
pancreatic head which measures 9 mm in maximum diameter on image
[DATE]. No evidence of pancreatic ductal dilatation or pancreas
divisum.

Spleen:  Within normal limits in size and appearance.

Adrenals/Urinary Tract: No masses identified. Several tiny renal
cysts noted bilaterally. No evidence of hydronephrosis.

Stomach/Bowel: Visualized portion unremarkable.

Vascular/Lymphatic: No pathologically enlarged lymph nodes
identified. No abdominal aortic aneurysm.

Other:  None.

Musculoskeletal:  No suspicious bone lesions identified.
IMPRESSION: Several tiny cystic lesions in the pancreatic head and tail, largest
measuring 13 mm. These likely represent indolent cystic neoplasms
such as side-branch IPMNs. In patient of this age, continued
follow-up by MRI is recommended in 2 years. This recommendation
follows ACR consensus guidelines: Management of Incidental
Pancreatic Cysts: A White Paper of the ACR Incidental Findings
Committee. [HOSPITAL] [id number];[DATE].

## 2021-04-02 DIAGNOSIS — H43813 Vitreous degeneration, bilateral: Secondary | ICD-10-CM | POA: Diagnosis not present

## 2021-04-12 DIAGNOSIS — H26491 Other secondary cataract, right eye: Secondary | ICD-10-CM | POA: Diagnosis not present

## 2021-04-23 DIAGNOSIS — Z9841 Cataract extraction status, right eye: Secondary | ICD-10-CM | POA: Diagnosis not present

## 2021-05-07 DIAGNOSIS — Z96652 Presence of left artificial knee joint: Secondary | ICD-10-CM | POA: Diagnosis not present

## 2021-05-07 DIAGNOSIS — M5136 Other intervertebral disc degeneration, lumbar region: Secondary | ICD-10-CM | POA: Diagnosis not present

## 2021-05-07 DIAGNOSIS — M1612 Unilateral primary osteoarthritis, left hip: Secondary | ICD-10-CM | POA: Diagnosis not present

## 2021-05-08 DIAGNOSIS — M1712 Unilateral primary osteoarthritis, left knee: Secondary | ICD-10-CM | POA: Diagnosis not present

## 2021-05-08 DIAGNOSIS — N400 Enlarged prostate without lower urinary tract symptoms: Secondary | ICD-10-CM | POA: Diagnosis not present

## 2021-05-08 DIAGNOSIS — G4700 Insomnia, unspecified: Secondary | ICD-10-CM | POA: Diagnosis not present

## 2021-05-08 DIAGNOSIS — E7800 Pure hypercholesterolemia, unspecified: Secondary | ICD-10-CM | POA: Diagnosis not present

## 2021-05-08 DIAGNOSIS — I10 Essential (primary) hypertension: Secondary | ICD-10-CM | POA: Diagnosis not present

## 2021-05-21 DIAGNOSIS — M5136 Other intervertebral disc degeneration, lumbar region: Secondary | ICD-10-CM | POA: Diagnosis not present

## 2021-05-21 DIAGNOSIS — M4187 Other forms of scoliosis, lumbosacral region: Secondary | ICD-10-CM | POA: Diagnosis not present

## 2021-05-21 DIAGNOSIS — M48061 Spinal stenosis, lumbar region without neurogenic claudication: Secondary | ICD-10-CM | POA: Diagnosis not present

## 2021-05-21 DIAGNOSIS — M79605 Pain in left leg: Secondary | ICD-10-CM | POA: Diagnosis not present

## 2021-05-30 DIAGNOSIS — R7303 Prediabetes: Secondary | ICD-10-CM | POA: Diagnosis not present

## 2021-05-30 DIAGNOSIS — I700 Atherosclerosis of aorta: Secondary | ICD-10-CM | POA: Diagnosis not present

## 2021-05-30 DIAGNOSIS — L219 Seborrheic dermatitis, unspecified: Secondary | ICD-10-CM | POA: Diagnosis not present

## 2021-05-30 DIAGNOSIS — E7800 Pure hypercholesterolemia, unspecified: Secondary | ICD-10-CM | POA: Diagnosis not present

## 2021-05-30 DIAGNOSIS — I4892 Unspecified atrial flutter: Secondary | ICD-10-CM | POA: Diagnosis not present

## 2021-05-30 DIAGNOSIS — G4700 Insomnia, unspecified: Secondary | ICD-10-CM | POA: Diagnosis not present

## 2021-05-30 DIAGNOSIS — R609 Edema, unspecified: Secondary | ICD-10-CM | POA: Diagnosis not present

## 2021-05-30 DIAGNOSIS — I10 Essential (primary) hypertension: Secondary | ICD-10-CM | POA: Diagnosis not present

## 2021-05-30 DIAGNOSIS — N400 Enlarged prostate without lower urinary tract symptoms: Secondary | ICD-10-CM | POA: Diagnosis not present

## 2021-05-31 DIAGNOSIS — R0982 Postnasal drip: Secondary | ICD-10-CM | POA: Diagnosis not present

## 2021-05-31 DIAGNOSIS — Z20822 Contact with and (suspected) exposure to covid-19: Secondary | ICD-10-CM | POA: Diagnosis not present

## 2021-06-03 DIAGNOSIS — R600 Localized edema: Secondary | ICD-10-CM | POA: Diagnosis not present

## 2021-06-03 DIAGNOSIS — G8929 Other chronic pain: Secondary | ICD-10-CM | POA: Diagnosis not present

## 2021-06-03 DIAGNOSIS — R32 Unspecified urinary incontinence: Secondary | ICD-10-CM | POA: Diagnosis not present

## 2021-06-03 DIAGNOSIS — N529 Male erectile dysfunction, unspecified: Secondary | ICD-10-CM | POA: Diagnosis not present

## 2021-06-03 DIAGNOSIS — E785 Hyperlipidemia, unspecified: Secondary | ICD-10-CM | POA: Diagnosis not present

## 2021-06-03 DIAGNOSIS — N400 Enlarged prostate without lower urinary tract symptoms: Secondary | ICD-10-CM | POA: Diagnosis not present

## 2021-06-03 DIAGNOSIS — Z823 Family history of stroke: Secondary | ICD-10-CM | POA: Diagnosis not present

## 2021-06-03 DIAGNOSIS — G629 Polyneuropathy, unspecified: Secondary | ICD-10-CM | POA: Diagnosis not present

## 2021-06-03 DIAGNOSIS — Z008 Encounter for other general examination: Secondary | ICD-10-CM | POA: Diagnosis not present

## 2021-06-03 DIAGNOSIS — Z7982 Long term (current) use of aspirin: Secondary | ICD-10-CM | POA: Diagnosis not present

## 2021-06-03 DIAGNOSIS — I10 Essential (primary) hypertension: Secondary | ICD-10-CM | POA: Diagnosis not present

## 2021-06-13 DIAGNOSIS — M5441 Lumbago with sciatica, right side: Secondary | ICD-10-CM | POA: Diagnosis not present

## 2021-06-13 DIAGNOSIS — M48061 Spinal stenosis, lumbar region without neurogenic claudication: Secondary | ICD-10-CM | POA: Diagnosis not present

## 2021-06-13 DIAGNOSIS — M545 Low back pain, unspecified: Secondary | ICD-10-CM | POA: Diagnosis not present

## 2021-06-13 DIAGNOSIS — M5442 Lumbago with sciatica, left side: Secondary | ICD-10-CM | POA: Diagnosis not present

## 2021-06-13 DIAGNOSIS — M47816 Spondylosis without myelopathy or radiculopathy, lumbar region: Secondary | ICD-10-CM | POA: Diagnosis not present

## 2021-06-24 DIAGNOSIS — M47816 Spondylosis without myelopathy or radiculopathy, lumbar region: Secondary | ICD-10-CM | POA: Diagnosis not present

## 2021-06-24 DIAGNOSIS — M5136 Other intervertebral disc degeneration, lumbar region: Secondary | ICD-10-CM | POA: Diagnosis not present

## 2021-06-24 DIAGNOSIS — M48061 Spinal stenosis, lumbar region without neurogenic claudication: Secondary | ICD-10-CM | POA: Diagnosis not present

## 2021-08-29 DIAGNOSIS — N400 Enlarged prostate without lower urinary tract symptoms: Secondary | ICD-10-CM | POA: Diagnosis not present

## 2021-08-29 DIAGNOSIS — I10 Essential (primary) hypertension: Secondary | ICD-10-CM | POA: Diagnosis not present

## 2021-08-29 DIAGNOSIS — E7800 Pure hypercholesterolemia, unspecified: Secondary | ICD-10-CM | POA: Diagnosis not present

## 2021-08-29 DIAGNOSIS — G4700 Insomnia, unspecified: Secondary | ICD-10-CM | POA: Diagnosis not present

## 2021-08-29 DIAGNOSIS — M1712 Unilateral primary osteoarthritis, left knee: Secondary | ICD-10-CM | POA: Diagnosis not present

## 2021-09-06 DIAGNOSIS — M48062 Spinal stenosis, lumbar region with neurogenic claudication: Secondary | ICD-10-CM | POA: Diagnosis not present

## 2021-09-06 DIAGNOSIS — I10 Essential (primary) hypertension: Secondary | ICD-10-CM | POA: Diagnosis not present

## 2021-09-06 DIAGNOSIS — Z6827 Body mass index (BMI) 27.0-27.9, adult: Secondary | ICD-10-CM | POA: Diagnosis not present

## 2021-09-30 DIAGNOSIS — M1712 Unilateral primary osteoarthritis, left knee: Secondary | ICD-10-CM | POA: Diagnosis not present

## 2021-09-30 DIAGNOSIS — N400 Enlarged prostate without lower urinary tract symptoms: Secondary | ICD-10-CM | POA: Diagnosis not present

## 2021-09-30 DIAGNOSIS — E7800 Pure hypercholesterolemia, unspecified: Secondary | ICD-10-CM | POA: Diagnosis not present

## 2021-09-30 DIAGNOSIS — I10 Essential (primary) hypertension: Secondary | ICD-10-CM | POA: Diagnosis not present

## 2021-09-30 DIAGNOSIS — G4700 Insomnia, unspecified: Secondary | ICD-10-CM | POA: Diagnosis not present

## 2021-11-12 DIAGNOSIS — M5416 Radiculopathy, lumbar region: Secondary | ICD-10-CM | POA: Diagnosis not present

## 2021-11-12 DIAGNOSIS — M5116 Intervertebral disc disorders with radiculopathy, lumbar region: Secondary | ICD-10-CM | POA: Diagnosis not present

## 2021-11-15 DIAGNOSIS — N400 Enlarged prostate without lower urinary tract symptoms: Secondary | ICD-10-CM | POA: Diagnosis not present

## 2021-11-15 DIAGNOSIS — Z1389 Encounter for screening for other disorder: Secondary | ICD-10-CM | POA: Diagnosis not present

## 2021-11-15 DIAGNOSIS — R7303 Prediabetes: Secondary | ICD-10-CM | POA: Diagnosis not present

## 2021-11-15 DIAGNOSIS — Z0001 Encounter for general adult medical examination with abnormal findings: Secondary | ICD-10-CM | POA: Diagnosis not present

## 2021-11-15 DIAGNOSIS — L219 Seborrheic dermatitis, unspecified: Secondary | ICD-10-CM | POA: Diagnosis not present

## 2021-11-15 DIAGNOSIS — I10 Essential (primary) hypertension: Secondary | ICD-10-CM | POA: Diagnosis not present

## 2021-11-15 DIAGNOSIS — R609 Edema, unspecified: Secondary | ICD-10-CM | POA: Diagnosis not present

## 2021-11-15 DIAGNOSIS — G4700 Insomnia, unspecified: Secondary | ICD-10-CM | POA: Diagnosis not present

## 2021-11-15 DIAGNOSIS — E7800 Pure hypercholesterolemia, unspecified: Secondary | ICD-10-CM | POA: Diagnosis not present

## 2021-11-15 DIAGNOSIS — I4892 Unspecified atrial flutter: Secondary | ICD-10-CM | POA: Diagnosis not present

## 2021-12-03 DIAGNOSIS — M9901 Segmental and somatic dysfunction of cervical region: Secondary | ICD-10-CM | POA: Diagnosis not present

## 2021-12-03 DIAGNOSIS — M9903 Segmental and somatic dysfunction of lumbar region: Secondary | ICD-10-CM | POA: Diagnosis not present

## 2021-12-03 DIAGNOSIS — M5116 Intervertebral disc disorders with radiculopathy, lumbar region: Secondary | ICD-10-CM | POA: Diagnosis not present

## 2021-12-03 DIAGNOSIS — M50123 Cervical disc disorder at C6-C7 level with radiculopathy: Secondary | ICD-10-CM | POA: Diagnosis not present

## 2021-12-05 DIAGNOSIS — M5116 Intervertebral disc disorders with radiculopathy, lumbar region: Secondary | ICD-10-CM | POA: Diagnosis not present

## 2021-12-05 DIAGNOSIS — M9903 Segmental and somatic dysfunction of lumbar region: Secondary | ICD-10-CM | POA: Diagnosis not present

## 2021-12-05 DIAGNOSIS — M50123 Cervical disc disorder at C6-C7 level with radiculopathy: Secondary | ICD-10-CM | POA: Diagnosis not present

## 2021-12-05 DIAGNOSIS — M9901 Segmental and somatic dysfunction of cervical region: Secondary | ICD-10-CM | POA: Diagnosis not present

## 2021-12-09 DIAGNOSIS — M9901 Segmental and somatic dysfunction of cervical region: Secondary | ICD-10-CM | POA: Diagnosis not present

## 2021-12-09 DIAGNOSIS — M9903 Segmental and somatic dysfunction of lumbar region: Secondary | ICD-10-CM | POA: Diagnosis not present

## 2021-12-09 DIAGNOSIS — M5116 Intervertebral disc disorders with radiculopathy, lumbar region: Secondary | ICD-10-CM | POA: Diagnosis not present

## 2021-12-09 DIAGNOSIS — M50123 Cervical disc disorder at C6-C7 level with radiculopathy: Secondary | ICD-10-CM | POA: Diagnosis not present

## 2021-12-10 DIAGNOSIS — M5116 Intervertebral disc disorders with radiculopathy, lumbar region: Secondary | ICD-10-CM | POA: Diagnosis not present

## 2021-12-10 DIAGNOSIS — M9903 Segmental and somatic dysfunction of lumbar region: Secondary | ICD-10-CM | POA: Diagnosis not present

## 2021-12-10 DIAGNOSIS — M50123 Cervical disc disorder at C6-C7 level with radiculopathy: Secondary | ICD-10-CM | POA: Diagnosis not present

## 2021-12-10 DIAGNOSIS — M9901 Segmental and somatic dysfunction of cervical region: Secondary | ICD-10-CM | POA: Diagnosis not present

## 2021-12-11 DIAGNOSIS — M9901 Segmental and somatic dysfunction of cervical region: Secondary | ICD-10-CM | POA: Diagnosis not present

## 2021-12-11 DIAGNOSIS — M5116 Intervertebral disc disorders with radiculopathy, lumbar region: Secondary | ICD-10-CM | POA: Diagnosis not present

## 2021-12-11 DIAGNOSIS — M50123 Cervical disc disorder at C6-C7 level with radiculopathy: Secondary | ICD-10-CM | POA: Diagnosis not present

## 2021-12-11 DIAGNOSIS — M9903 Segmental and somatic dysfunction of lumbar region: Secondary | ICD-10-CM | POA: Diagnosis not present

## 2021-12-16 DIAGNOSIS — M50123 Cervical disc disorder at C6-C7 level with radiculopathy: Secondary | ICD-10-CM | POA: Diagnosis not present

## 2021-12-16 DIAGNOSIS — M9901 Segmental and somatic dysfunction of cervical region: Secondary | ICD-10-CM | POA: Diagnosis not present

## 2021-12-16 DIAGNOSIS — M5116 Intervertebral disc disorders with radiculopathy, lumbar region: Secondary | ICD-10-CM | POA: Diagnosis not present

## 2021-12-16 DIAGNOSIS — M9903 Segmental and somatic dysfunction of lumbar region: Secondary | ICD-10-CM | POA: Diagnosis not present

## 2021-12-17 DIAGNOSIS — M9903 Segmental and somatic dysfunction of lumbar region: Secondary | ICD-10-CM | POA: Diagnosis not present

## 2021-12-17 DIAGNOSIS — M9901 Segmental and somatic dysfunction of cervical region: Secondary | ICD-10-CM | POA: Diagnosis not present

## 2021-12-17 DIAGNOSIS — M50123 Cervical disc disorder at C6-C7 level with radiculopathy: Secondary | ICD-10-CM | POA: Diagnosis not present

## 2021-12-17 DIAGNOSIS — M5116 Intervertebral disc disorders with radiculopathy, lumbar region: Secondary | ICD-10-CM | POA: Diagnosis not present

## 2021-12-19 DIAGNOSIS — M50123 Cervical disc disorder at C6-C7 level with radiculopathy: Secondary | ICD-10-CM | POA: Diagnosis not present

## 2021-12-19 DIAGNOSIS — M9903 Segmental and somatic dysfunction of lumbar region: Secondary | ICD-10-CM | POA: Diagnosis not present

## 2021-12-19 DIAGNOSIS — M9901 Segmental and somatic dysfunction of cervical region: Secondary | ICD-10-CM | POA: Diagnosis not present

## 2021-12-19 DIAGNOSIS — M5116 Intervertebral disc disorders with radiculopathy, lumbar region: Secondary | ICD-10-CM | POA: Diagnosis not present

## 2021-12-23 DIAGNOSIS — M50123 Cervical disc disorder at C6-C7 level with radiculopathy: Secondary | ICD-10-CM | POA: Diagnosis not present

## 2021-12-23 DIAGNOSIS — M9903 Segmental and somatic dysfunction of lumbar region: Secondary | ICD-10-CM | POA: Diagnosis not present

## 2021-12-23 DIAGNOSIS — M47812 Spondylosis without myelopathy or radiculopathy, cervical region: Secondary | ICD-10-CM | POA: Diagnosis not present

## 2021-12-23 DIAGNOSIS — M9901 Segmental and somatic dysfunction of cervical region: Secondary | ICD-10-CM | POA: Diagnosis not present

## 2021-12-23 DIAGNOSIS — M5116 Intervertebral disc disorders with radiculopathy, lumbar region: Secondary | ICD-10-CM | POA: Diagnosis not present

## 2021-12-23 DIAGNOSIS — M47816 Spondylosis without myelopathy or radiculopathy, lumbar region: Secondary | ICD-10-CM | POA: Diagnosis not present

## 2021-12-24 DIAGNOSIS — M50123 Cervical disc disorder at C6-C7 level with radiculopathy: Secondary | ICD-10-CM | POA: Diagnosis not present

## 2021-12-24 DIAGNOSIS — M9903 Segmental and somatic dysfunction of lumbar region: Secondary | ICD-10-CM | POA: Diagnosis not present

## 2021-12-24 DIAGNOSIS — M9901 Segmental and somatic dysfunction of cervical region: Secondary | ICD-10-CM | POA: Diagnosis not present

## 2021-12-24 DIAGNOSIS — M5116 Intervertebral disc disorders with radiculopathy, lumbar region: Secondary | ICD-10-CM | POA: Diagnosis not present

## 2021-12-26 DIAGNOSIS — M9903 Segmental and somatic dysfunction of lumbar region: Secondary | ICD-10-CM | POA: Diagnosis not present

## 2021-12-26 DIAGNOSIS — M50123 Cervical disc disorder at C6-C7 level with radiculopathy: Secondary | ICD-10-CM | POA: Diagnosis not present

## 2021-12-26 DIAGNOSIS — M5116 Intervertebral disc disorders with radiculopathy, lumbar region: Secondary | ICD-10-CM | POA: Diagnosis not present

## 2021-12-26 DIAGNOSIS — M9901 Segmental and somatic dysfunction of cervical region: Secondary | ICD-10-CM | POA: Diagnosis not present

## 2021-12-30 DIAGNOSIS — M9901 Segmental and somatic dysfunction of cervical region: Secondary | ICD-10-CM | POA: Diagnosis not present

## 2021-12-30 DIAGNOSIS — M5116 Intervertebral disc disorders with radiculopathy, lumbar region: Secondary | ICD-10-CM | POA: Diagnosis not present

## 2021-12-30 DIAGNOSIS — M50123 Cervical disc disorder at C6-C7 level with radiculopathy: Secondary | ICD-10-CM | POA: Diagnosis not present

## 2021-12-30 DIAGNOSIS — M9903 Segmental and somatic dysfunction of lumbar region: Secondary | ICD-10-CM | POA: Diagnosis not present

## 2022-01-01 DIAGNOSIS — M50123 Cervical disc disorder at C6-C7 level with radiculopathy: Secondary | ICD-10-CM | POA: Diagnosis not present

## 2022-01-01 DIAGNOSIS — M5116 Intervertebral disc disorders with radiculopathy, lumbar region: Secondary | ICD-10-CM | POA: Diagnosis not present

## 2022-01-01 DIAGNOSIS — M9901 Segmental and somatic dysfunction of cervical region: Secondary | ICD-10-CM | POA: Diagnosis not present

## 2022-01-01 DIAGNOSIS — M9903 Segmental and somatic dysfunction of lumbar region: Secondary | ICD-10-CM | POA: Diagnosis not present

## 2022-01-06 DIAGNOSIS — M50123 Cervical disc disorder at C6-C7 level with radiculopathy: Secondary | ICD-10-CM | POA: Diagnosis not present

## 2022-01-06 DIAGNOSIS — M9901 Segmental and somatic dysfunction of cervical region: Secondary | ICD-10-CM | POA: Diagnosis not present

## 2022-01-06 DIAGNOSIS — M9903 Segmental and somatic dysfunction of lumbar region: Secondary | ICD-10-CM | POA: Diagnosis not present

## 2022-01-06 DIAGNOSIS — M5116 Intervertebral disc disorders with radiculopathy, lumbar region: Secondary | ICD-10-CM | POA: Diagnosis not present

## 2022-01-07 DIAGNOSIS — M25512 Pain in left shoulder: Secondary | ICD-10-CM | POA: Diagnosis not present

## 2022-01-08 DIAGNOSIS — M9903 Segmental and somatic dysfunction of lumbar region: Secondary | ICD-10-CM | POA: Diagnosis not present

## 2022-01-08 DIAGNOSIS — M5116 Intervertebral disc disorders with radiculopathy, lumbar region: Secondary | ICD-10-CM | POA: Diagnosis not present

## 2022-01-08 DIAGNOSIS — M50123 Cervical disc disorder at C6-C7 level with radiculopathy: Secondary | ICD-10-CM | POA: Diagnosis not present

## 2022-01-08 DIAGNOSIS — M9901 Segmental and somatic dysfunction of cervical region: Secondary | ICD-10-CM | POA: Diagnosis not present

## 2022-01-09 DIAGNOSIS — M9903 Segmental and somatic dysfunction of lumbar region: Secondary | ICD-10-CM | POA: Diagnosis not present

## 2022-01-09 DIAGNOSIS — M9901 Segmental and somatic dysfunction of cervical region: Secondary | ICD-10-CM | POA: Diagnosis not present

## 2022-01-09 DIAGNOSIS — M50123 Cervical disc disorder at C6-C7 level with radiculopathy: Secondary | ICD-10-CM | POA: Diagnosis not present

## 2022-01-09 DIAGNOSIS — M5116 Intervertebral disc disorders with radiculopathy, lumbar region: Secondary | ICD-10-CM | POA: Diagnosis not present

## 2022-01-16 DIAGNOSIS — I10 Essential (primary) hypertension: Secondary | ICD-10-CM | POA: Diagnosis not present

## 2022-01-16 DIAGNOSIS — M48062 Spinal stenosis, lumbar region with neurogenic claudication: Secondary | ICD-10-CM | POA: Diagnosis not present

## 2022-01-16 DIAGNOSIS — Z6827 Body mass index (BMI) 27.0-27.9, adult: Secondary | ICD-10-CM | POA: Diagnosis not present

## 2022-01-20 DIAGNOSIS — M9903 Segmental and somatic dysfunction of lumbar region: Secondary | ICD-10-CM | POA: Diagnosis not present

## 2022-01-20 DIAGNOSIS — M50123 Cervical disc disorder at C6-C7 level with radiculopathy: Secondary | ICD-10-CM | POA: Diagnosis not present

## 2022-01-20 DIAGNOSIS — M9901 Segmental and somatic dysfunction of cervical region: Secondary | ICD-10-CM | POA: Diagnosis not present

## 2022-01-20 DIAGNOSIS — M5116 Intervertebral disc disorders with radiculopathy, lumbar region: Secondary | ICD-10-CM | POA: Diagnosis not present

## 2022-01-23 DIAGNOSIS — M50123 Cervical disc disorder at C6-C7 level with radiculopathy: Secondary | ICD-10-CM | POA: Diagnosis not present

## 2022-01-23 DIAGNOSIS — M9903 Segmental and somatic dysfunction of lumbar region: Secondary | ICD-10-CM | POA: Diagnosis not present

## 2022-01-23 DIAGNOSIS — M9901 Segmental and somatic dysfunction of cervical region: Secondary | ICD-10-CM | POA: Diagnosis not present

## 2022-01-23 DIAGNOSIS — M5116 Intervertebral disc disorders with radiculopathy, lumbar region: Secondary | ICD-10-CM | POA: Diagnosis not present

## 2022-01-27 DIAGNOSIS — M5116 Intervertebral disc disorders with radiculopathy, lumbar region: Secondary | ICD-10-CM | POA: Diagnosis not present

## 2022-01-27 DIAGNOSIS — M5416 Radiculopathy, lumbar region: Secondary | ICD-10-CM | POA: Diagnosis not present

## 2022-01-29 DIAGNOSIS — M9901 Segmental and somatic dysfunction of cervical region: Secondary | ICD-10-CM | POA: Diagnosis not present

## 2022-01-29 DIAGNOSIS — M50123 Cervical disc disorder at C6-C7 level with radiculopathy: Secondary | ICD-10-CM | POA: Diagnosis not present

## 2022-01-29 DIAGNOSIS — M5116 Intervertebral disc disorders with radiculopathy, lumbar region: Secondary | ICD-10-CM | POA: Diagnosis not present

## 2022-01-29 DIAGNOSIS — M9903 Segmental and somatic dysfunction of lumbar region: Secondary | ICD-10-CM | POA: Diagnosis not present

## 2022-02-05 DIAGNOSIS — M25512 Pain in left shoulder: Secondary | ICD-10-CM | POA: Diagnosis not present

## 2022-05-01 DIAGNOSIS — R7303 Prediabetes: Secondary | ICD-10-CM | POA: Diagnosis not present

## 2022-05-05 DIAGNOSIS — I10 Essential (primary) hypertension: Secondary | ICD-10-CM | POA: Diagnosis not present

## 2022-05-05 DIAGNOSIS — M545 Low back pain, unspecified: Secondary | ICD-10-CM | POA: Diagnosis not present

## 2022-05-05 DIAGNOSIS — E7800 Pure hypercholesterolemia, unspecified: Secondary | ICD-10-CM | POA: Diagnosis not present

## 2022-05-05 DIAGNOSIS — R011 Cardiac murmur, unspecified: Secondary | ICD-10-CM | POA: Diagnosis not present

## 2022-05-05 DIAGNOSIS — N400 Enlarged prostate without lower urinary tract symptoms: Secondary | ICD-10-CM | POA: Diagnosis not present

## 2022-05-05 DIAGNOSIS — L219 Seborrheic dermatitis, unspecified: Secondary | ICD-10-CM | POA: Diagnosis not present

## 2022-05-05 DIAGNOSIS — I4892 Unspecified atrial flutter: Secondary | ICD-10-CM | POA: Diagnosis not present

## 2022-05-05 DIAGNOSIS — M25512 Pain in left shoulder: Secondary | ICD-10-CM | POA: Diagnosis not present

## 2022-05-05 DIAGNOSIS — R609 Edema, unspecified: Secondary | ICD-10-CM | POA: Diagnosis not present

## 2022-05-05 DIAGNOSIS — R7303 Prediabetes: Secondary | ICD-10-CM | POA: Diagnosis not present

## 2022-05-05 DIAGNOSIS — G4700 Insomnia, unspecified: Secondary | ICD-10-CM | POA: Diagnosis not present

## 2022-05-21 DIAGNOSIS — R011 Cardiac murmur, unspecified: Secondary | ICD-10-CM | POA: Diagnosis not present

## 2022-06-01 NOTE — Progress Notes (Unsigned)
Cardiology Office Note Date:  06/01/2022  Patient ID:  Joshua Arroyo, Joshua Arroyo 08-31-1941, MRN [id number] PCP:  Maury Dus, MD  Cardiologist:  Dr. Lovena Le   Chief Complaint: annual visi  History of Present Illness: Joshua Arroyo is a 81 y.o. male with history of AFlutter ablated, developed A tach (originating from [place] by Dr. Tanna Furry note), HTN, HLD.   I saw him in 2018, he was feeling very well.  Asked about perhaps stopping some medicines to try and reduce the number.  He reported no concerning side effects.  He had not had any arrhythmia that he has been aware of, no CP, palpitations or SOB, no dizziness, near syncope or syncope.  No exertional incapacities.  Worked about the yard/house, no formal exercise, no difficulties with ADLs.  He reported labs done annually with his PMD,last was in January, no reported abnormalities. He was noted to have SB 47 (known for him) and asymptomatic, stable EKG intervals, no changes were made to his medicines.   He saw Dr. 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  Planned to continue sotalol as long as HR maintained 50 or better.  He discussed importance of prevention of tic bites, encouraged him to survey his skin and obtain some permethrin treated clothing to wear when working in the yard.  No changes were made to his therapy.   I saw him for pre-op/annual visit 07/19/2019, he was doing pretty well.  Reported a bad L knee, stated that he was to have had surgery early in the year though delayed 2/2 COVID.  He did get an injection fairly recently and this has helped his discomfort significantly, but would like to get the surgery behind him and will need our clearance.  Timing looks mid October currently.  He mentions his PMD was concerned that his  pulse/HR was so slow and asked he mention it to [place]. He denied any kind of CP, palpitations or SOB, no DOE, PND or orthopnea. He reported some infrequent dizzy spells that he has just attributed to aging.  No near syncope or syncope.  EKG noted SB at 46, given some reports of dizziness and Dr. Tanna Furry threshold to remain on sotalol, he was planned to wean off and planned to come back in to revisit hie HR and clearance. Spoke about his remote At post flutter ablation, though the patient recalls that being an extraordinarily stressful time in his life as well and thinks this was more the trigger then anything.  I saw him back 07/29/19, Despite his knee he remains very active.  He continues to deny CP, palpitations, no exertional incapacities, no unusual fatigue.  No dizzy spells, no near syncope or syncope He brings home BP and HR readings all that look good RCRI is 0.4 DUKE score is 58.2, 9.89METS Felt reasonable cardiac candidate for his planned surgery  He has seen Dr. Lovena Le a couple time since then, no symptoms of SVT/arrhythmia, unfortunately still with knee pain and advised to f/u with ortho.  He had an echo done 05/21/22 via his PMD it seems, LVEF 65%, no WMA, mildly dilated LA, AOV not well visualized, probably bicuspid, mild AS, root 4.2cm, ascending 4.1cm  *** ? Echo report *** symptoms    Past Medical History:  Diagnosis Date   Atrial flutter (  [place])    post ablation   BPH (benign prostatic hyperplasia)    Hypercholesteremia    Hypertension     Past Surgical History:  Procedure Laterality Date   CARDIOVERSION     CERVICAL SPINE SURGERY  2011    Current Outpatient Medications  Medication Sig Dispense Refill   amoxicillin (AMOXIL) 500 MG tablet Take 4 tablets by mouth as needed. Prior to dental appt     aspirin 325 MG EC tablet Take 2 tablets by mouth daily.     DiphenhydrAMINE HCl, Sleep, (NIGHTTIME SLEEP AID) 50 MG tablet Take 50 mg by mouth at bedtime as needed for  sleep.     FIBER PO Take 2 scoop by mouth daily as needed (fiber).     finasteride (PROSCAR) 5 MG tablet Take 5 mg by mouth daily.  0   furosemide (LASIX) 20 MG tablet Take 1 tablet by mouth daily.  0   losartan-hydrochlorothiazide (HYZAAR) 100-12.5 MG per tablet Take 1 tablet by mouth every morning.  0   Multiple Vitamins-Minerals (ONE DAILY MENS HEALTH PO) Take 1 capsule by mouth daily.      OMEGA-3 FATTY ACIDS PO Take 1 tablet by mouth daily. Take 2 tablets by mouth in the morning and 1 tablet in the eveving     oxyCODONE (OXY IR/ROXICODONE) 5 MG immediate release tablet Take 1 tablet by mouth as needed for pain.     pravastatin (PRAVACHOL) 40 MG tablet Take 40 mg by mouth daily.     sildenafil (VIAGRA) 100 MG tablet Take 100 mg by mouth daily as needed for erectile dysfunction.      tamsulosin (FLOMAX) 0.4 MG CAPS Take 0.4 mg by mouth daily.     vitamin B-12 (CYANOCOBALAMIN) 250 MCG tablet Take 250 mcg by mouth daily.     No current facility-administered medications for this visit.    Allergies:   Patient has no known allergies.   Social History:  The patient  reports that he has never smoked. He has never used smokeless tobacco. He reports current alcohol use. He reports that he does not use drugs.   Family History:  The patient's family history includes Heart Problems in his father.  ROS:  Please see the history of present illness.    All other systems are reviewed and otherwise negative.   PHYSICAL EXAM:  VS:  There were no vitals taken for this visit. BMI: There is no height or weight on file to calculate BMI. Well nourished, well developed, in no acute distress  HEENT: normocephalic, atraumatic  Neck: no JVD, carotid bruits or masses Cardiac: *** RRR; bradycardic, no significant murmurs, no rubs, or gallops Lungs: *** CTA b/l, no wheezing, rhonchi or rales  Abd: soft, nontender MS: no deformity or  atrophy Ext:  *** trace if any edema noted Skin: warm and dry, no  rash Neuro:  No gross deficits appreciated Psych: euthymic mood, full affect    EKG:  Done today and reviewed by myself  ***   05/18/08: TTE SUMMARY  -  Left ventricular ejection fraction was estimated , range being 55        % to 60 %. There were no left ventricular regional wall        motion abnormalities.  -  Aortic valve thickness was mildly increased. There was moderate        aortic valvular regurgitation.  -  There was a mild mitral valve prolapse involving the anterior  leaflet. There was mild mitral valvular regurgitation.  -  Interatrial septum intact. The left atrium was dilated. There was        no left atrial appendage thrombus identified.   Recent Labs: No results found for requested labs within last 365 days.  No results found for requested labs within last 365 days.   CrCl cannot be calculated (Patient's most recent lab result is older than the maximum 21 days allowed.).   Wt Readings from Last 3 Encounters:  08/02/20 195 lb (88.5 kg)  01/02/20 190 lb (86.2 kg)  07/29/19 209 lb (94.8 kg)     Other studies reviewed: Additional studies/records reviewed today include: summarized above  ASSESSMENT AND PLAN:  1. Atach, Hx of Fluttert ablated     Off Sotalol 2/2 bradycardia     ***      2. Edema    *** Not particularly noted today     Felt to be dependent by his description, resolves over night     4. HTN     *** Looks OK, no changes  5. HLD     *** Not discussed today     Disposition:  ***   Current medicines are reviewed at length with the patient today.  The patient did not have any concerns regarding medicines.  Haywood Lasso, PA-C 06/01/2022 5:28 PM     [place] [address] [place] [id number] [phone] (office)  [phone] (fax)

## 2022-06-04 ENCOUNTER — Encounter: Payer: Self-pay | Admitting: Physician Assistant

## 2022-06-04 ENCOUNTER — Ambulatory Visit: Payer: Medicare HMO | Admitting: Physician Assistant

## 2022-06-04 VITALS — BP 124/66 | HR 56 | Ht 72.0 in | Wt 206.4 lb

## 2022-06-04 DIAGNOSIS — I471 Supraventricular tachycardia: Secondary | ICD-10-CM

## 2022-06-04 DIAGNOSIS — I10 Essential (primary) hypertension: Secondary | ICD-10-CM | POA: Diagnosis not present

## 2022-06-04 DIAGNOSIS — R609 Edema, unspecified: Secondary | ICD-10-CM | POA: Diagnosis not present

## 2022-06-04 DIAGNOSIS — I350 Nonrheumatic aortic (valve) stenosis: Secondary | ICD-10-CM | POA: Diagnosis not present

## 2022-06-04 DIAGNOSIS — I4892 Unspecified atrial flutter: Secondary | ICD-10-CM | POA: Diagnosis not present

## 2022-06-04 NOTE — Patient Instructions (Signed)
Medication Instructions:  Your physician recommends that you continue on your current medications as directed. Please refer to the Current Medication list given to you today.  *If you need a refill on your cardiac medications before your next appointment, please call your pharmacy*   Lab Work: NONE If you have labs (blood work) drawn today and your tests are completely normal, you will receive your results only by: [place] (if you have MyChart) OR A paper copy in the mail If you have any lab test that is abnormal or we need to change your treatment, we will call you to review the results.   Testing/Procedures: NONE   Follow-Up: At [facility], you and your health needs are our priority.  As part of our continuing mission to provide you with exceptional heart care, we have created designated Provider Care Teams.  These Care Teams include your primary Cardiologist (physician) and Advanced Practice Providers (APPs -  Physician Assistants and Nurse Practitioners) who all work together to provide you with the care you need, when you need it.  We recommend signing up for the patient portal called "MyChart".  Sign up information is provided on this After Visit Summary.  MyChart is used to connect with patients for Virtual Visits (Telemedicine).  Patients are able to view lab/test results, encounter notes, upcoming appointments, etc.  Non-urgent messages can be sent to your provider as well.   To learn more about what you can do with MyChart, go to [email or web address].    Your next appointment:   1 year(s)  The format for your next appointment:   In Person  Provider:   DR. Lovena Le   Important Information About Sugar

## 2022-08-26 DIAGNOSIS — U071 COVID-19: Secondary | ICD-10-CM | POA: Diagnosis not present

## 2022-08-26 DIAGNOSIS — R0981 Nasal congestion: Secondary | ICD-10-CM | POA: Diagnosis not present

## 2022-08-26 DIAGNOSIS — J029 Acute pharyngitis, unspecified: Secondary | ICD-10-CM | POA: Diagnosis not present

## 2022-10-27 DIAGNOSIS — Z0100 Encounter for examination of eyes and vision without abnormal findings: Secondary | ICD-10-CM | POA: Diagnosis not present

## 2022-10-27 DIAGNOSIS — H5211 Myopia, right eye: Secondary | ICD-10-CM | POA: Diagnosis not present

## 2022-12-24 DIAGNOSIS — Z96652 Presence of left artificial knee joint: Secondary | ICD-10-CM | POA: Diagnosis not present

## 2022-12-24 DIAGNOSIS — Z7689 Persons encountering health services in other specified circumstances: Secondary | ICD-10-CM | POA: Diagnosis not present

## 2022-12-24 DIAGNOSIS — R7989 Other specified abnormal findings of blood chemistry: Secondary | ICD-10-CM | POA: Diagnosis not present

## 2022-12-24 DIAGNOSIS — E782 Mixed hyperlipidemia: Secondary | ICD-10-CM | POA: Diagnosis not present

## 2022-12-24 DIAGNOSIS — R7303 Prediabetes: Secondary | ICD-10-CM | POA: Diagnosis not present

## 2022-12-24 DIAGNOSIS — I10 Essential (primary) hypertension: Secondary | ICD-10-CM | POA: Diagnosis not present

## 2022-12-24 DIAGNOSIS — D489 Neoplasm of uncertain behavior, unspecified: Secondary | ICD-10-CM | POA: Diagnosis not present

## 2022-12-24 DIAGNOSIS — Z720 Tobacco use: Secondary | ICD-10-CM | POA: Diagnosis not present

## 2023-01-08 DIAGNOSIS — L280 Lichen simplex chronicus: Secondary | ICD-10-CM | POA: Diagnosis not present

## 2023-01-08 DIAGNOSIS — C44722 Squamous cell carcinoma of skin of right lower limb, including hip: Secondary | ICD-10-CM | POA: Diagnosis not present

## 2023-01-08 DIAGNOSIS — D229 Melanocytic nevi, unspecified: Secondary | ICD-10-CM | POA: Diagnosis not present

## 2023-01-08 DIAGNOSIS — L570 Actinic keratosis: Secondary | ICD-10-CM | POA: Diagnosis not present

## 2023-01-08 DIAGNOSIS — C44729 Squamous cell carcinoma of skin of left lower limb, including hip: Secondary | ICD-10-CM | POA: Diagnosis not present

## 2023-01-08 DIAGNOSIS — D489 Neoplasm of uncertain behavior, unspecified: Secondary | ICD-10-CM | POA: Diagnosis not present

## 2023-01-08 DIAGNOSIS — Z1283 Encounter for screening for malignant neoplasm of skin: Secondary | ICD-10-CM | POA: Diagnosis not present

## 2023-03-19 DIAGNOSIS — C44729 Squamous cell carcinoma of skin of left lower limb, including hip: Secondary | ICD-10-CM | POA: Diagnosis not present

## 2023-03-19 DIAGNOSIS — C44722 Squamous cell carcinoma of skin of right lower limb, including hip: Secondary | ICD-10-CM | POA: Diagnosis not present

## 2023-04-13 DIAGNOSIS — Z125 Encounter for screening for malignant neoplasm of prostate: Secondary | ICD-10-CM | POA: Diagnosis not present

## 2023-04-13 DIAGNOSIS — I10 Essential (primary) hypertension: Secondary | ICD-10-CM | POA: Diagnosis not present

## 2023-04-15 DIAGNOSIS — Z131 Encounter for screening for diabetes mellitus: Secondary | ICD-10-CM | POA: Diagnosis not present

## 2023-04-15 DIAGNOSIS — I10 Essential (primary) hypertension: Secondary | ICD-10-CM | POA: Diagnosis not present

## 2023-04-15 DIAGNOSIS — Z0000 Encounter for general adult medical examination without abnormal findings: Secondary | ICD-10-CM | POA: Diagnosis not present

## 2023-04-22 DIAGNOSIS — Z8589 Personal history of malignant neoplasm of other organs and systems: Secondary | ICD-10-CM | POA: Diagnosis not present

## 2023-04-22 DIAGNOSIS — L570 Actinic keratosis: Secondary | ICD-10-CM | POA: Diagnosis not present

## 2023-04-22 DIAGNOSIS — Z1283 Encounter for screening for malignant neoplasm of skin: Secondary | ICD-10-CM | POA: Diagnosis not present

## 2023-04-22 DIAGNOSIS — D229 Melanocytic nevi, unspecified: Secondary | ICD-10-CM | POA: Diagnosis not present

## 2023-04-22 DIAGNOSIS — D692 Other nonthrombocytopenic purpura: Secondary | ICD-10-CM | POA: Diagnosis not present

## 2023-07-15 DIAGNOSIS — L853 Xerosis cutis: Secondary | ICD-10-CM | POA: Diagnosis not present

## 2023-07-15 DIAGNOSIS — Z85828 Personal history of other malignant neoplasm of skin: Secondary | ICD-10-CM | POA: Diagnosis not present

## 2023-07-15 DIAGNOSIS — D692 Other nonthrombocytopenic purpura: Secondary | ICD-10-CM | POA: Diagnosis not present

## 2023-07-15 DIAGNOSIS — L570 Actinic keratosis: Secondary | ICD-10-CM | POA: Diagnosis not present

## 2023-07-15 DIAGNOSIS — L989 Disorder of the skin and subcutaneous tissue, unspecified: Secondary | ICD-10-CM | POA: Diagnosis not present

## 2023-07-19 NOTE — Progress Notes (Unsigned)
Cardiology Office Note Date:  07/19/2023  Patient ID:  Joshua Arroyo, Joshua Arroyo 1941-10-14, MRN [id number] PCP:  Elias Else, MD (Inactive) >> Dr. Mayford Knife (at [facility]) Cardiologist:  Dr. Ladona Ridgel   Chief Complaint:   annual visit  History of Present Illness: [name] is a 82 y.o. male with history of AFlutter ablated, developed A tach (originating from [place] by Dr. Lubertha Basque note), HTN, HLD.  He saw Dr. Ladona Ridgel 06/2018, he noted he had been bitten by tic (x2) initially symptomatic and treated with a month of antibiotics (with reported negative titers), and the 2nd with out significant symptoms, noted known asymptomatic SB, no arrhythmias noted by patient.  Planned to continue sotalol as long as HR maintained 50 or better.  He discussed importance of prevention of tic bites, encouraged him to survey his skin and obtain some permethrin treated clothing to wear when working in the yard.  No changes were made to his therapy.   I saw him for pre-op/annual visit 07/19/2019, he was doing pretty well.  Reported a bad L knee, stated that he was to have had surgery early in the year though delayed 2/2 COVID.  He did get an injection fairly recently and this has helped his discomfort significantly, but would like to get the surgery behind him and will need our clearance.  Timing looks mid October currently.  He mentions his PMD was concerned that his pulse/HR was so slow and asked he mention it to [place]. He denied any kind of CP, palpitations or SOB, no DOE, PND or orthopnea. He reported some infrequent dizzy spells that he has just attributed to aging.  No near syncope or syncope.  EKG noted SB at 46, given some reports of dizziness and Dr. Lubertha Basque threshold to remain on sotalol, he was planned to wean off and planned to come back in to revisit hie HR and clearance. Spoke about his remote At post flutter ablation, though the patient recalls that being an extraordinarily stressful time in his life as well and  thinks this was more the trigger then anything.  I saw him back 07/29/19, Despite his knee he remains very active.  He continues to deny CP, palpitations, no exertional incapacities, no unusual fatigue.  No dizzy spells, no near syncope or syncope He brings home BP and HR readings all that look good RCRI is 0.4 DUKE score is 58.2, 9.89METS Felt reasonable cardiac candidate for his planned surgery  He has seen Dr. Ladona Ridgel a couple time since then, last was 07/2020 no symptoms of SVT/arrhythmia, unfortunately still with knee pain and advised to f/u with ortho.  He had an echo done 05/21/22 via his PMD it seems LVEF 65%, no WMA, mildly dilated LA, AOV not well visualized, probably bicuspid, mild AS, root 4.2cm, ascending 4.1cm  I saw him 06/04/22 He is a bit worried about his echo, but feeling well No symptoms of arrhythmia or bradycardia He was out in the heat a few days ago got a little dizzy, but knows he does not drink enough water and is trying to do better with that. No other dizziness, no near syncope or syncope. No SOB No CP, palpitations. He does have a little swelling, for several months now, L>R, his PMD has suggested support stockings, and taking low dose lasix. 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  Much of his days at the desk Last fall had some  back trouble and doing back exercises but not his stationary bike like he had been prior to that His PMD did the echo after his physical and hearing a murmur. 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   AAD hx: Sotalol stopped 07/18/22 w/symptomatic  bradycardia    Past Medical History:  Diagnosis Date   Atrial flutter (HCC)    post ablation   BPH (benign prostatic hyperplasia)    Hypercholesteremia    Hypertension     Past Surgical History:  Procedure Laterality Date   CARDIOVERSION     CERVICAL SPINE SURGERY  2011    Current Outpatient Medications  Medication Sig Dispense Refill   amoxicillin (AMOXIL) 500 MG tablet Take 4 tablets by mouth as needed. Prior to dental appt (Patient not taking: Reported on 06/04/2022)     aspirin 325 MG EC tablet Take 1 tablet by mouth daily.     DiphenhydrAMINE HCl, Sleep, (NIGHTTIME SLEEP AID) 50 MG tablet Take 50 mg by mouth at bedtime as needed for sleep.     FIBER PO Take 2 scoop by mouth daily as needed (fiber). (Patient not taking: Reported on 06/04/2022)     finasteride (PROSCAR) 5 MG tablet Take 5 mg by mouth daily.  0   furosemide (LASIX) 20 MG tablet Take 1 tablet by mouth daily.  0   losartan-hydrochlorothiazide (HYZAAR) 100-12.5 MG per tablet Take 1 tablet by mouth every morning.  0   Multiple Vitamins-Minerals (ONE DAILY MENS HEALTH PO) Take 1 capsule by mouth daily.  (Patient not taking: Reported on 06/04/2022)     OMEGA-3 FATTY ACIDS PO Take 1 tablet by mouth daily. Take 2 tablets by mouth in the morning and 1 tablet in the eveving (Patient not taking: Reported on 06/04/2022)     oxyCODONE (OXY IR/ROXICODONE) 5 MG immediate release tablet Take 1 tablet by mouth as needed for pain. (Patient not taking: Reported on 06/04/2022)     pravastatin (PRAVACHOL) 40 MG tablet Take 40 mg by mouth daily.     sildenafil (VIAGRA) 100 MG tablet Take 100 mg by mouth daily as needed for erectile dysfunction.      tamsulosin (FLOMAX) 0.4 MG CAPS Take 0.4 mg by mouth daily.     vitamin B-12 (CYANOCOBALAMIN) 250 MCG tablet Take 250 mcg by mouth daily. (Patient not taking: Reported on 06/04/2022)     No current facility-administered medications for this visit.    Allergies:   Patient has no known  allergies.   Social History:  The patient  reports that he has never smoked. He has never used smokeless tobacco. He reports current alcohol use. He reports that he does not use drugs.   Family History:  The patient's family history includes Heart Problems in his father.  ROS:  Please see the history of present illness.    All other systems are reviewed and otherwise negative.   PHYSICAL EXAM:  VS:  There were no vitals taken for this visit. BMI: There is no height or weight on file to calculate BMI. Well nourished, well developed, in no acute distress  HEENT: normocephalic, atraumatic  Neck: no JVD, carotid  bruits or masses Cardiac:  RRR; very slight SM does not radiate to neck,  no significant murmurs, no rubs, or gallops Lungs: CTA b/l, no wheezing, rhonchi or rales  Abd: soft, nontender MS: no deformity or  atrophy Ext: trace if any edema noted Skin: warm and dry, no rash Neuro:  No gross deficits appreciated Psych: euthymic mood, full affect    EKG:  Done today and reviewed by myself  SB 57bpm, 1st degree AVBlock , QRS 90ms , LAD is new, PAC    05/18/08: TTE SUMMARY  -  Left ventricular ejection fraction was estimated , range being 55        % to 60 %. There were no left ventricular regional wall        motion abnormalities.  -  Aortic valve thickness was mildly increased. There was moderate        aortic valvular regurgitation.  -  There was a mild mitral valve prolapse involving the anterior        leaflet. There was mild mitral valvular regurgitation.  -  Interatrial septum intact. The left atrium was dilated. There was        no left atrial appendage thrombus identified.   Recent Labs: No results found for requested labs within last 365 days.  No results found for requested labs within last 365 days.   CrCl cannot be calculated (Patient's most recent lab result is older than the maximum 21 days allowed.).   Wt Readings from Last 3 Encounters:  06/04/22  206 lb 6.4 oz (93.6 kg)  08/02/20 195 lb (88.5 kg)  01/02/20 190 lb (86.2 kg)     Other studies reviewed: Additional studies/records reviewed today include: summarized above  ASSESSMENT AND PLAN:  1. Atach, Hx of Flutter ablated     Off Sotalol 2/2 bradycardia     Doing well      2. Edema     Minimal, nothing pitting noted today     Felt to be dependent by his description, resolves over night, not new by review of his chart has mentioned this back some years     Re-discussed elevation when at his desk     4. HTN     Looks OK, no changes  5. HLD     He is off his statin     New PMD, managing there  6. AS No findings of concern noted Can consider surveillance echos avery couple years or as needed for any symptoms. Will get baseline CT chest/aorta with some aortic rood dilation     Disposition:  back again in a year otherwise, plan to see Dr.Taylor, sooner if needed.   Current medicines are reviewed at length with the patient today.  The patient did not have any concerns regarding medicines.  Judith Blonder, PA-C 07/19/2023 2:31 PM     [facility] [address] [place] [address] [phone] (office)  [phone] (fax)

## 2023-07-21 ENCOUNTER — Ambulatory Visit: Payer: Medicare HMO | Attending: Physician Assistant | Admitting: Physician Assistant

## 2023-07-21 ENCOUNTER — Encounter: Payer: Self-pay | Admitting: Physician Assistant

## 2023-07-21 VITALS — BP 120/78 | HR 57 | Ht 72.0 in | Wt 199.6 lb

## 2023-07-21 DIAGNOSIS — I350 Nonrheumatic aortic (valve) stenosis: Secondary | ICD-10-CM

## 2023-07-21 DIAGNOSIS — I10 Essential (primary) hypertension: Secondary | ICD-10-CM

## 2023-07-21 DIAGNOSIS — I4719 Other supraventricular tachycardia: Secondary | ICD-10-CM | POA: Diagnosis not present

## 2023-07-21 DIAGNOSIS — R609 Edema, unspecified: Secondary | ICD-10-CM | POA: Diagnosis not present

## 2023-07-21 NOTE — Patient Instructions (Signed)
Medication Instructions:   Your physician recommends that you continue on your current medications as directed. Please refer to the Current Medication list given to you today.   *If you need a refill on your cardiac medications before your next appointment, please call your pharmacy*   Lab Work: BMET TODAY     If you have labs (blood work) drawn today and your tests are completely normal, you will receive your results only by: MyChart Message (if you have MyChart) OR A paper copy in the mail If you have any lab test that is abnormal or we need to change your treatment, we will call you to review the results.   Testing/Procedures:  Non-Cardiac CT Angiography (CTA), is a special type of CT scan that uses a computer to produce multi-dimensional views of major blood vessels throughout the body. In CT angiography, a contrast material is injected through an IV to help visualize the blood vessels    Follow-Up: At [facility], you and your health needs are our priority.  As part of our continuing mission to provide you with exceptional heart care, we have created designated Provider Care Teams.  These Care Teams include your primary Cardiologist (physician) and Advanced Practice Providers (APPs -  Physician Assistants and Nurse Practitioners) who all work together to provide you with the care you need, when you need it.  We recommend signing up for the patient portal called "MyChart".  Sign up information is provided on this After Visit Summary.  MyChart is used to connect with patients for Virtual Visits (Telemedicine).  Patients are able to view lab/test results, encounter notes, upcoming appointments, etc.  Non-urgent messages can be sent to your provider as well.   To learn more about what you can do with MyChart, go to [email or web address].    Your next appointment:   1 year(s)  Provider:   Lewayne Bunting, MD    Other Instructions

## 2023-07-22 LAB — BASIC METABOLIC PANEL
BUN/Creatinine Ratio: 15 (ref 10–24)
BUN: 18 mg/dL (ref 8–27)
CO2: 26 mmol/L (ref 20–29)
Calcium: 8.9 mg/dL (ref 8.6–10.2)
Chloride: 101 mmol/L (ref 96–106)
Creatinine, Ser: 1.21 mg/dL (ref 0.76–1.27)
Glucose: 107 mg/dL — ABNORMAL HIGH (ref 70–99)
Potassium: 4.1 mmol/L (ref 3.5–5.2)
Sodium: 141 mmol/L (ref 134–144)
eGFR: 60 mL/min/{1.73_m2} (ref >59)

## 2023-07-28 ENCOUNTER — Ambulatory Visit (HOSPITAL_COMMUNITY)
Admission: RE | Admit: 2023-07-28 | Discharge: 2023-07-28 | Disposition: A | Discharge status: Home / Self Care | Payer: Medicare HMO | Source: Ambulatory Visit | Attending: Physician Assistant | Admitting: Physician Assistant

## 2023-07-28 DIAGNOSIS — I2510 Atherosclerotic heart disease of native coronary artery without angina pectoris: Secondary | ICD-10-CM | POA: Diagnosis not present

## 2023-07-28 DIAGNOSIS — I350 Nonrheumatic aortic (valve) stenosis: Secondary | ICD-10-CM | POA: Diagnosis not present

## 2023-07-28 DIAGNOSIS — I700 Atherosclerosis of aorta: Secondary | ICD-10-CM | POA: Diagnosis not present

## 2023-07-28 DIAGNOSIS — I7121 Aneurysm of the ascending aorta, without rupture: Secondary | ICD-10-CM | POA: Diagnosis not present

## 2023-07-28 MED ORDER — IOHEXOL 350 MG/ML SOLN
75.0000 mL | Freq: Once | INTRAVENOUS | Status: AC | PRN
  Administered 2023-07-28: 75 mL via INTRAVENOUS

## 2023-08-11 DIAGNOSIS — I10 Essential (primary) hypertension: Secondary | ICD-10-CM | POA: Diagnosis not present

## 2023-12-08 DIAGNOSIS — H524 Presbyopia: Secondary | ICD-10-CM | POA: Diagnosis not present

## 2024-01-28 DIAGNOSIS — L918 Other hypertrophic disorders of the skin: Secondary | ICD-10-CM | POA: Diagnosis not present

## 2024-01-28 DIAGNOSIS — L821 Other seborrheic keratosis: Secondary | ICD-10-CM | POA: Diagnosis not present

## 2024-01-28 DIAGNOSIS — L814 Other melanin hyperpigmentation: Secondary | ICD-10-CM | POA: Diagnosis not present

## 2024-01-28 DIAGNOSIS — L720 Epidermal cyst: Secondary | ICD-10-CM | POA: Diagnosis not present

## 2024-01-28 DIAGNOSIS — B079 Viral wart, unspecified: Secondary | ICD-10-CM | POA: Diagnosis not present

## 2024-03-02 DIAGNOSIS — Z6827 Body mass index (BMI) 27.0-27.9, adult: Secondary | ICD-10-CM | POA: Diagnosis not present

## 2024-03-02 DIAGNOSIS — M48062 Spinal stenosis, lumbar region with neurogenic claudication: Secondary | ICD-10-CM | POA: Diagnosis not present

## 2024-03-15 DIAGNOSIS — M5416 Radiculopathy, lumbar region: Secondary | ICD-10-CM | POA: Diagnosis not present

## 2024-03-15 DIAGNOSIS — M5116 Intervertebral disc disorders with radiculopathy, lumbar region: Secondary | ICD-10-CM | POA: Diagnosis not present

## 2024-04-19 DIAGNOSIS — W19XXXS Unspecified fall, sequela: Secondary | ICD-10-CM | POA: Diagnosis not present

## 2024-04-19 DIAGNOSIS — M6281 Muscle weakness (generalized): Secondary | ICD-10-CM | POA: Diagnosis not present

## 2024-04-19 DIAGNOSIS — R682 Dry mouth, unspecified: Secondary | ICD-10-CM | POA: Diagnosis not present

## 2024-04-19 DIAGNOSIS — G3184 Mild cognitive impairment, so stated: Secondary | ICD-10-CM | POA: Diagnosis not present

## 2024-04-19 DIAGNOSIS — Z13220 Encounter for screening for lipoid disorders: Secondary | ICD-10-CM | POA: Diagnosis not present

## 2024-04-19 DIAGNOSIS — Z0000 Encounter for general adult medical examination without abnormal findings: Secondary | ICD-10-CM | POA: Diagnosis not present

## 2024-04-19 DIAGNOSIS — M47816 Spondylosis without myelopathy or radiculopathy, lumbar region: Secondary | ICD-10-CM | POA: Diagnosis not present

## 2024-04-19 DIAGNOSIS — N400 Enlarged prostate without lower urinary tract symptoms: Secondary | ICD-10-CM | POA: Diagnosis not present

## 2024-04-19 DIAGNOSIS — K5901 Slow transit constipation: Secondary | ICD-10-CM | POA: Diagnosis not present

## 2024-04-19 DIAGNOSIS — I10 Essential (primary) hypertension: Secondary | ICD-10-CM | POA: Diagnosis not present

## 2024-07-26 DIAGNOSIS — H1132 Conjunctival hemorrhage, left eye: Secondary | ICD-10-CM | POA: Diagnosis not present

## 2024-09-15 ENCOUNTER — Ambulatory Visit: Attending: Student | Admitting: Student

## 2024-09-15 ENCOUNTER — Encounter: Payer: Self-pay | Admitting: Student

## 2024-09-15 VITALS — BP 164/66 | HR 68 | Ht 72.0 in | Wt 206.6 lb

## 2024-09-15 DIAGNOSIS — I4719 Other supraventricular tachycardia: Secondary | ICD-10-CM

## 2024-09-15 DIAGNOSIS — R609 Edema, unspecified: Secondary | ICD-10-CM | POA: Diagnosis not present

## 2024-09-15 DIAGNOSIS — I10 Essential (primary) hypertension: Secondary | ICD-10-CM | POA: Diagnosis not present

## 2024-09-15 DIAGNOSIS — I350 Nonrheumatic aortic (valve) stenosis: Secondary | ICD-10-CM

## 2024-09-15 NOTE — Patient Instructions (Addendum)
 Medication Instructions:    Your physician recommends that you continue on your current medications as directed. Please refer to the Current Medication list given to you today.   *If you need a refill on your cardiac medications before your next appointment, please call your pharmacy*   Lab Work: NONE ORDERED  TODAY    If you have labs (blood work) drawn today and your tests are completely normal, you will receive your results only by: MyChart Message (if you have MyChart) OR A paper copy in the mail If you have any lab test that is abnormal or we need to change your treatment, we will call you to review the results.   Testing/Procedures:  Your physician has requested that you have an echocardiogram. Echocardiography is a painless test that uses sound waves to create images of your heart. It provides your doctor with information about the size and shape of your heart and how well your heart's chambers and valves are working. This procedure takes approximately one hour. There are no restrictions for this procedure. Please do NOT wear cologne, perfume, aftershave, or lotions (deodorant is allowed). Please arrive 15 minutes prior to your appointment time.  Please note: We ask at that you not bring children with you during ultrasound (echo/ vascular) testing. Due to room size and safety concerns, children are not allowed in the ultrasound rooms during exams. Our front office staff cannot provide observation of children in our lobby area while testing is being conducted. An adult accompanying a patient to their appointment will only be allowed in the ultrasound room at the discretion of the ultrasound technician under special circumstances. We apologize for any inconvenience.    Follow-Up: At [facility], you and your health needs are our priority.  As part of our continuing mission to provide you with exceptional heart care, our providers are all part of one team.  This team includes  your primary Cardiologist (physician) and Advanced Practice Providers or APPs (Physician Assistants and Nurse Practitioners) who all work together to provide you with the care you need, when you need it.  Your next appointment:  AFTER ECHOCARDIOGRAM   1 -2  month(s)  Provider:  Ozell Jodie Passey, PA-C    We recommend signing up for the patient portal called MyChart.  Sign up information is provided on this After Visit Summary.  MyChart is used to connect with patients for Virtual Visits (Telemedicine).  Patients are able to view lab/test results, encounter notes, upcoming appointments, etc.  Non-urgent messages can be sent to your provider as well.   To learn more about what you can do with MyChart, go to [email or web address].   Other Instructions

## 2024-09-15 NOTE — Progress Notes (Signed)
  Electrophysiology Office Note:   Date:  09/15/2024  ID:  Coleston, Dirosa April 26, 1941, MRN [id number]  Primary Cardiologist: None Electrophysiologist: Danelle Birmingham, MD   Cardiology APP:  Leverne Charlies Helling, PA-C  Electrophysiologist:  Danelle Birmingham, MD      History of Present Illness:   Joshua Arroyo is a 83 y.o. male with h/o AFlutter ablated, developed A tach (originating from [place] by Dr. Adrian note), HTN, HLD  seen today for routine electrophysiology followup.   Since last being seen in our clinic the patient reports doing OK overall. 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 He describes brief SOB at times, but mostly at night. Denies heavy salt intake and thinks he doesn't get enough hydration. BP elevated on arrival, but at home has been in 100-120s systolic. Otherwise, he denies chest pain, orthopnea, nausea, vomiting, dizziness, syncope, edema, weight gain, or early satiety.   Review of systems complete and found to be negative unless listed in HPI.   EP Information / Studies Reviewed:    EKG is ordered today. Personal review as below.  EKG Interpretation Date/Time:  Thursday September 15 2024 09:00:05 EDT Ventricular Rate:  71 PR Interval:  256 QRS Duration:  104 QT Interval:  418 QTC Calculation: 454 R Axis:   21  Text Interpretation: Sinus rhythm with 1st degree A-V block with Premature supraventricular complexes Cannot rule out Anterior infarct (cited on or before 21-Jul-2023) When compared with ECG of 21-Jul-2023 14:43, Premature supraventricular complexes are now Present QRS axis Shifted right Nonspecific T wave abnormality now evident in Lateral leads QT has lengthened Confirmed by Lesia Sharper [phone]) on 09/15/2024 9:01:58 AM    Arrhythmia/Device History No specialty comments available.   Physical Exam:   VS:  BP (!) 164/66   Pulse 68   Ht 6' (1.829 m)   Wt 206 lb 9.6 oz (93.7 kg)   SpO2 93%   BMI  28.02 kg/m    Wt Readings from Last 3 Encounters:  09/15/24 206 lb 9.6 oz (93.7 kg)  07/21/23 199 lb 9.6 oz (90.5 kg)  06/04/22 206 lb 6.4 oz (93.6 kg)     GEN: No acute distress NECK: No JVD; No carotid bruits CARDIAC: Regular rate and rhythm with occasional ectopy; 3/6 SEM with high pitched sound heard best in the R 2-3 intercostal space RESPIRATORY:  Clear to auscultation without rales, wheezing or rhonchi  ABDOMEN: Soft, non-tender, non-distended EXTREMITIES:  No edema; No deformity   ASSESSMENT AND PLAN:    Atach H/o Flutter, ablated EKG today shows NSR with PACs Off sotalol  2/2 bradycardia  Peripheral edema Well controlled on losartan/HCtz  HTN Stable on current regimen   HLD Per PCP  AS, possible bicuspid AV Mild by Echo 04/2022 scan with possible bicuspid Exam concerning for significant worsening of his previously described mild AS.  Will order ECHO ASAP and discussed the likelihood that would further result in need for TEE +/- L/RHC if felt to be severe, as well as referral to structural team.   Follow up with Me after ASAP Echo to discuss and arrange next steps.   Signed, Sharper Prentice Lesia, PA-C

## 2024-09-20 ENCOUNTER — Ambulatory Visit (HOSPITAL_COMMUNITY)
Admission: RE | Admit: 2024-09-20 | Discharge: 2024-09-20 | Disposition: A | Discharge status: Home / Self Care | Source: Ambulatory Visit | Attending: Student | Admitting: Student

## 2024-09-20 DIAGNOSIS — I080 Rheumatic disorders of both mitral and aortic valves: Secondary | ICD-10-CM | POA: Diagnosis not present

## 2024-09-20 DIAGNOSIS — I4892 Unspecified atrial flutter: Secondary | ICD-10-CM | POA: Insufficient documentation

## 2024-09-20 DIAGNOSIS — I77810 Thoracic aortic ectasia: Secondary | ICD-10-CM | POA: Insufficient documentation

## 2024-09-20 DIAGNOSIS — E785 Hyperlipidemia, unspecified: Secondary | ICD-10-CM | POA: Diagnosis not present

## 2024-09-20 DIAGNOSIS — R000 Tachycardia, unspecified: Secondary | ICD-10-CM | POA: Diagnosis not present

## 2024-09-20 DIAGNOSIS — I499 Cardiac arrhythmia, unspecified: Secondary | ICD-10-CM | POA: Diagnosis not present

## 2024-09-20 DIAGNOSIS — I350 Nonrheumatic aortic (valve) stenosis: Secondary | ICD-10-CM | POA: Diagnosis not present

## 2024-09-20 DIAGNOSIS — I10 Essential (primary) hypertension: Secondary | ICD-10-CM | POA: Diagnosis not present

## 2024-09-20 DIAGNOSIS — R9431 Abnormal electrocardiogram [ECG] [EKG]: Secondary | ICD-10-CM | POA: Insufficient documentation

## 2024-09-20 LAB — ECHOCARDIOGRAM COMPLETE
AR max vel: 0.89 cm2
AV Area VTI: 0.95 cm2
AV Area mean vel: 0.98 cm2
AV Mean grad: 38 mmHg
AV Peak grad: 68.6 mmHg
Ao pk vel: 4.14 m/s
Area-P 1/2: 10.25 cm2
Calc EF: 49.4 %
S' Lateral: 3.3 cm
Single Plane A2C EF: 49.7 %
Single Plane A4C EF: 49.1 %

## 2024-09-20 NOTE — Progress Notes (Signed)
  Echocardiogram 2D Echocardiogram has been performed.  Joshua Arroyo 09/20/2024, 3:51 PM

## 2024-09-21 ENCOUNTER — Ambulatory Visit: Payer: Self-pay | Admitting: Student

## 2024-09-21 DIAGNOSIS — I350 Nonrheumatic aortic (valve) stenosis: Secondary | ICD-10-CM

## 2024-09-26 ENCOUNTER — Telehealth: Payer: Self-pay | Admitting: Internal Medicine

## 2024-09-26 NOTE — Telephone Encounter (Signed)
Returned pt's call, see result note

## 2024-09-26 NOTE — Telephone Encounter (Signed)
  Per answering service: Patient returning call to office

## 2024-09-27 NOTE — Progress Notes (Unsigned)
 Patient ID: Joshua Arroyo MRN: [id number] DOB/AGE: 1941/06/26 83 y.o.  Primary Care Physician:Reade, Lamar, MD EP Cardiologist: [place]  CC:  Aortic valvular disease management     FOCUSED PROBLEM LIST:   Aortic stenosis AVA 0.95, MG 38, V-max 4.1, EF 40 to 45% TTE October 2025 EKG sinus rhythm with first-degree AV block and PACs Cardiomyopathy EF 60 to 65% TTE June 2023  EF 40 to 45% TTE October 2025 Atrial tachycardia/flutter Status post ablation 2012 Hyperlipidemia Aortic atherosclerosis Chest CT 2024 Hypertension CKD stage II BMI 28/BSA 2.25 September 2024: Patient consents to use of AI scribe. The patient is a 83 year old male with the above listed medical problems referred for recommendations regarding his aortic valvular disease.  Patient was recently seen by the EP division.  He reported exertional fatigue.  This prompted an echocardiogram which demonstrated progression to severe aortic stenosis with a new moderately reduced ejection fraction.         Past Medical History:  Diagnosis Date   Atrial flutter (HCC)    post ablation   BPH (benign prostatic hyperplasia)    Hypercholesteremia    Hypertension     Past Surgical History:  Procedure Laterality Date   CARDIOVERSION     CERVICAL SPINE SURGERY  2011    Family History  Problem Relation Age of Onset   Heart Problems Father     Social History   Socioeconomic History   Marital status: Married    Spouse name: Donny   Number of children: 3   Years of education: MA   Highest education level: Not on file  Occupational History   Not on file  Tobacco Use   Smoking status: Never   Smokeless tobacco: Never  Vaping Use   Vaping status: Never Used  Substance and Sexual Activity   Alcohol use: Yes    Comment: Socially   Drug use: No   Sexual activity: Not on file  Other Topics Concern   Not on file  Social History Narrative   Pt lives at home with his spouse.   Caffeine Use: 1 cup of coffee  daily   Social Drivers of Corporate Investment Banker Strain: Not on file  Food Insecurity: Low Risk  (04/19/2024)   Received from [facility]   Hunger Vital Sign    Within the past 12 months, you worried that your food would run out before you got money to buy more: Never true    Within the past 12 months, the food you bought just didn't last and you didn't have money to get more. : Never true  Transportation Needs: No Transportation Needs (04/19/2024)   Received from [facility]    In the past 12 months, has lack of reliable transportation kept you from medical appointments, meetings, work or from getting things needed for daily living? : No  Physical Activity: Not on file  Stress: Not on file  Social Connections: Not on file  Intimate Partner Violence: Not on file     Prior to Admission medications   Medication Sig Start Date End Date Taking? Authorizing Provider  aspirin 325 MG EC tablet Take 1 tablet by mouth daily. 10/03/19   [provider]  DiphenhydrAMINE HCl, Sleep, (NIGHTTIME SLEEP AID) 50 MG tablet Take 50 mg by mouth at bedtime as needed for sleep.    [provider]  finasteride (PROSCAR) 5 MG tablet Take 5 mg by mouth daily. 06/21/17   [provider]  losartan-hydrochlorothiazide (HYZAAR) 100-12.5 MG per tablet Take 1 tablet by mouth every morning. 03/14/15   [provider]  OMEGA-3 FATTY ACIDS PO Take 1 tablet by mouth daily. Take 2 tablets by mouth in the morning and 1 tablet in the eveving    [provider]  sildenafil (VIAGRA) 100 MG tablet Take 100 mg by mouth daily as needed for erectile dysfunction.     [provider]  tamsulosin (FLOMAX) 0.4 MG CAPS Take 0.4 mg by mouth daily.    [provider]    No Known Allergies  REVIEW OF SYSTEMS:  General: no fevers/chills/night sweats Eyes: no blurry vision, diplopia, or amaurosis ENT: no sore throat or hearing loss Resp: no cough,  wheezing, or hemoptysis CV: no edema or palpitations GI: no abdominal pain, nausea, vomiting, diarrhea, or constipation GU: no dysuria, frequency, or hematuria Skin: no rash Neuro: no headache, numbness, tingling, or weakness of extremities Musculoskeletal: no joint pain or swelling Heme: no bleeding, DVT, or easy bruising Endo: no polydipsia or polyuria  There were no vitals taken for this visit.  PHYSICAL EXAM: GEN:  AO x 3 in no acute distress HEENT: normal Dentition: Normal*** Neck: JVP normal. +2***carotid upstrokes without bruits. No thyromegaly. Lungs: equal expansion, clear bilaterally CV: Apex is discrete and nondisplaced, RRR without murmur or gallop*** Abd: soft, non-tender, non-distended; no bruit; positive bowel sounds Ext: no edema, ecchymoses, or cyanosis Vascular: 2+ femoral pulses, 2+ radial pulses       Skin: warm and dry without rash Neuro: CN II-XII grossly intact; motor and sensory grossly intact    DATA AND STUDIES:  EKG: 2025 sinus rhythm with first-degree AV block and PACs  EKG Interpretation Date/Time:    Ventricular Rate:    PR Interval:    QRS Duration:    QT Interval:    QTC Calculation:   R Axis:      Text Interpretation:          CARDIAC STUDIES: Refer to CV Procedures and Imaging Tabs  No results found for requested labs within last 365 days.   STS RISK CALCULATOR: Pending  NYHA CLASS: ***    ASSESSMENT AND PLAN:   1. Nonrheumatic aortic valve stenosis   2. Cardiomyopathy, unspecified type (HCC)   3. Atrial tachycardia   4. Hyperlipidemia LDL goal <70   5. Aortic atherosclerosis   6. Primary hypertension   7. CKD (chronic kidney disease) stage 2, GFR 60-89 ml/min     Aortic stenosis: Patient has developed severe aortic stenosis with a decrement in LV function.*** Cardiomyopathy: Likely due to progression of aortic stenosis.*** Atrial tachycardia: Followed by EP division.  Continue aspirin 325 mg Hyperlipidemia: By  virtue of the presence of aortic atherosclerosis the patient's LDL goal is less than 70.  Will defer statin therapy in this advanced age individual. Aortic atherosclerosis: Defer statin therapy as above.  Continue aspirin 325 mg. Hypertension: Continue losartan/hydrochlorothiazide 100/12.5 mg daily*** CKD stage II: Continue losartan/hydrochlorothiazide 100/12.5mg  daily***   I have personally reviewed the patients imaging data as summarized above.  I have reviewed the natural history of aortic stenosis with the patient and family members who are present today. We have discussed the limitations of medical therapy and the poor prognosis associated with symptomatic aortic stenosis. We have also reviewed potential treatment options, including palliative medical therapy, conventional surgical aortic valve replacement, and transcatheter aortic valve replacement. We discussed treatment options in the context of this patient's specific comorbid medical conditions.  All of the patient's questions were answered today. Will make further recommendations based on the results of studies outlined above.   I spent *** minutes reviewing all clinical data during and prior to this visit including all relevant imaging studies, laboratories, clinical information from other health systems and prior notes from both Cardiology and other specialties, interviewing the patient, conducting a complete physical examination, and coordinating care in order to formulate a comprehensive and personalized evaluation and treatment plan.   Cambelle Suchecki K Breyah Akhter, MD  09/27/2024 7:51 PM    [facility] [address] [place], [place], [place]  [address] Phone: [phone]; Fax: [phone]

## 2024-09-27 NOTE — H&P (View-Only) (Signed)
 Patient ID: Joshua Arroyo MRN: [id number] DOB/AGE: 12-29-1940 83 y.o.  Primary Care Physician:Reade, Lamar, MD EP Cardiologist: [place]ell  CC:  Aortic valvular disease management     FOCUSED PROBLEM LIST:   Aortic stenosis AVA 0.95, MG 38, V-max 4.1, EF 40 to 45% TTE October 2025 EKG sinus rhythm with first-degree AV block and PACs Cardiomyopathy EF 60 to 65% TTE June 2023  EF 40 to 45% TTE October 2025 Atrial tachycardia/flutter Status post ablation 2012 Hyperlipidemia Aortic atherosclerosis Chest CT 2024 Hypertension CKD stage II BMI 28/BSA 2.25 September 2024: Patient consents to use of AI scribe. The patient is a 83 year old male with the above listed medical problems referred for recommendations regarding his aortic valvular disease.  Patient was recently seen by the EP division.  He reported exertional fatigue.  This prompted an echocardiogram which demonstrated progression to severe aortic stenosis with a new mildly reduced ejection fraction.  The patient is fairly active however he has noticed that when he ascends stairs he feels short of breath.  He takes care of of about 3 acres of property.  He has noticed that over the last few months he is slow down somewhat.  He denies any exertional chest pain but occasionally he does get chest tightness when he ascends the stairs.  He does not have the symptoms at rest.  He denies any orthopnea, paroxysmal rectal dyspnea, presyncope, or syncope.  He tells me this feels similar but not to the severity of when he developed atrial tachycardia and flutter 13 years ago.  He sees a education officer, community on a regular basis and denies any dental complaints.         Past Medical History:  Diagnosis Date   Atrial flutter (HCC)    post ablation   BPH (benign prostatic hyperplasia)    Hypercholesteremia    Hypertension     Past Surgical History:  Procedure Laterality Date   CARDIOVERSION     CERVICAL SPINE SURGERY  2011    Family History   Problem Relation Age of Onset   Heart Problems Father     Social History   Socioeconomic History   Marital status: Married    Spouse name: Joshua Arroyo   Number of children: 3   Years of education: MA   Highest education level: Not on file  Occupational History   Not on file  Tobacco Use   Smoking status: Never   Smokeless tobacco: Never  Vaping Use   Vaping status: Never Used  Substance and Sexual Activity   Alcohol use: Yes    Comment: Socially   Drug use: No   Sexual activity: Not on file  Other Topics Concern   Not on file  Social History Narrative   Pt lives at home with his spouse.   Caffeine Use: 1 cup of coffee daily   Social Drivers of Corporate Investment Banker Strain: Not on file  Food Insecurity: Low Risk  (04/19/2024)   Received from [facility]   Hunger Vital Sign    Within the past 12 months, you worried that your food would run out before you got money to buy more: Never true    Within the past 12 months, the food you bought just didn't last and you didn't have money to get more. : Never true  Transportation Needs: No Transportation Needs (04/19/2024)   Received from [facility]    In the past 12 months, has lack of reliable transportation  kept you from medical appointments, meetings, work or from getting things needed for daily living? : No  Physical Activity: Not on file  Stress: Not on file  Social Connections: Not on file  Intimate Partner Violence: Not on file     Prior to Admission medications   Medication Sig Start Date End Date Taking? Authorizing Provider  aspirin 325 MG EC tablet Take 1 tablet by mouth daily. 10/03/19   [provider]  DiphenhydrAMINE HCl, Sleep, (NIGHTTIME SLEEP AID) 50 MG tablet Take 50 mg by mouth at bedtime as needed for sleep.    [provider]  finasteride (PROSCAR) 5 MG tablet Take 5 mg by mouth daily. 06/21/17   [provider]  losartan-hydrochlorothiazide (HYZAAR)  100-12.5 MG per tablet Take 1 tablet by mouth every morning. 03/14/15   [provider]  OMEGA-3 FATTY ACIDS PO Take 1 tablet by mouth daily. Take 2 tablets by mouth in the morning and 1 tablet in the eveving    [provider]  sildenafil (VIAGRA) 100 MG tablet Take 100 mg by mouth daily as needed for erectile dysfunction.     [provider]  tamsulosin (FLOMAX) 0.4 MG CAPS Take 0.4 mg by mouth daily.    [provider]    No Known Allergies  REVIEW OF SYSTEMS:  General: no fevers/chills/night sweats Eyes: no blurry vision, diplopia, or amaurosis ENT: no sore throat or hearing loss Resp: no cough, wheezing, or hemoptysis CV: no edema or palpitations GI: no abdominal pain, nausea, vomiting, diarrhea, or constipation GU: no dysuria, frequency, or hematuria Skin: no rash Neuro: no headache, numbness, tingling, or weakness of extremities Musculoskeletal: no joint pain or swelling Heme: no bleeding, DVT, or easy bruising Endo: no polydipsia or polyuria  BP 130/70 (BP Location: Right Arm, Patient Position: Sitting)   Pulse 68   Ht 6' (1.829 m)   Wt 215 lb 3.2 oz (97.6 kg)   SpO2 98%   BMI 29.19 kg/m   PHYSICAL EXAM: GEN:  AO x 3 in no acute distress HEENT: normal Dentition: Normal Neck: JVP normal. +2 carotid upstrokes without bruits. No thyromegaly. Lungs: equal expansion, clear bilaterally CV: Apex is discrete and nondisplaced, RRR with 3/6 SEM left lower sternal border Abd: soft, non-tender, non-distended; no bruit; positive bowel sounds Ext: no edema, ecchymoses, or cyanosis Vascular: 2+ femoral pulses, 2+ radial pulses       Skin: warm and dry without rash Neuro: CN II-XII grossly intact; motor and sensory grossly intact    DATA AND STUDIES:  EKG: 2025 sinus rhythm with first-degree AV block and PACs  EKG Interpretation Date/Time:    Ventricular Rate:    PR Interval:    QRS Duration:    QT Interval:    QTC Calculation:   R  Axis:      Text Interpretation:          CARDIAC STUDIES: Refer to CV Procedures and Imaging Tabs  No results found for requested labs within last 365 days.   STS RISK CALCULATOR: Pending  NYHA CLASS: 2    ASSESSMENT AND PLAN:   1. Nonrheumatic aortic valve stenosis   2. Cardiomyopathy, unspecified type (HCC)   3. Atrial tachycardia   4. Hyperlipidemia LDL goal <70   5. Aortic atherosclerosis   6. Primary hypertension   7. CKD (chronic kidney disease) stage 2, GFR 60-89 ml/min   8. Pre-procedure lab exam     Aortic stenosis: Patient has developed severe aortic stenosis with  a decrement in LV function.  He endorses NYHA class II symptoms.  This is a class I indication for intervention.  I will refer the patient for coronary angiography, right heart catheterization, CTA, and cardiothoracic surgical opinion. Cardiomyopathy: Likely due to progression of aortic stenosis.  Coronary angiography will help characterize his cardiomyopathy. Atrial tachycardia: Followed by EP division.  Continue aspirin 325 mg Hyperlipidemia: By virtue of the presence of aortic atherosclerosis the patient's LDL goal is less than 70.  Will defer statin therapy in this advanced age individual. Aortic atherosclerosis: Defer statin therapy as above.  Continue aspirin 325 mg. Hypertension: Continue losartan/hydrochlorothiazide 100/12.5 mg daily.  Blood pressure is well-controlled. CKD stage II: Continue losartan/hydrochlorothiazide 100/12.5mg  daily   I have personally reviewed the patients imaging data as summarized above.  I have reviewed the natural history of aortic stenosis with the patient and family members who are present today. We have discussed the limitations of medical therapy and the poor prognosis associated with symptomatic aortic stenosis. We have also reviewed potential treatment options, including palliative medical therapy, conventional surgical aortic valve replacement, and transcatheter  aortic valve replacement. We discussed treatment options in the context of this patient's specific comorbid medical conditions.   All of the patient's questions were answered today. Will make further recommendations based on the results of studies outlined above.   I spent 45 minutes reviewing all clinical data during and prior to this visit including all relevant imaging studies, laboratories, clinical information from other health systems and prior notes from both Cardiology and other specialties, interviewing the patient, conducting a complete physical examination, and coordinating care in order to formulate a comprehensive and personalized evaluation and treatment plan.   Josia Cueva K Yeshaya Vath, MD  09/28/2024 9:56 AM    [facility] [address] [place], [place], [place]  [address] Phone: [phone]; Fax: [phone]

## 2024-09-28 ENCOUNTER — Ambulatory Visit: Admitting: Student

## 2024-09-28 ENCOUNTER — Ambulatory Visit: Attending: Internal Medicine | Admitting: Internal Medicine

## 2024-09-28 VITALS — BP 130/70 | HR 68 | Ht 72.0 in | Wt 215.2 lb

## 2024-09-28 DIAGNOSIS — Z01812 Encounter for preprocedural laboratory examination: Secondary | ICD-10-CM | POA: Diagnosis not present

## 2024-09-28 DIAGNOSIS — N182 Chronic kidney disease, stage 2 (mild): Secondary | ICD-10-CM

## 2024-09-28 DIAGNOSIS — I4719 Other supraventricular tachycardia: Secondary | ICD-10-CM

## 2024-09-28 DIAGNOSIS — E785 Hyperlipidemia, unspecified: Secondary | ICD-10-CM

## 2024-09-28 DIAGNOSIS — I429 Cardiomyopathy, unspecified: Secondary | ICD-10-CM

## 2024-09-28 DIAGNOSIS — I700 Atherosclerosis of aorta: Secondary | ICD-10-CM | POA: Diagnosis not present

## 2024-09-28 DIAGNOSIS — I10 Essential (primary) hypertension: Secondary | ICD-10-CM | POA: Diagnosis not present

## 2024-09-28 DIAGNOSIS — I350 Nonrheumatic aortic (valve) stenosis: Secondary | ICD-10-CM | POA: Diagnosis not present

## 2024-09-28 LAB — CBC

## 2024-09-28 NOTE — Patient Instructions (Signed)
 Medication Instructions:  No medication changes were made at this visit. Continue current regimen.   *If you need a refill on your cardiac medications before your next appointment, please call your pharmacy*  Lab Work: To be completed today: CBC and BMP  If you have labs (blood work) drawn today and your tests are completely normal, you will receive your results only by: MyChart Message (if you have MyChart) OR A paper copy in the mail If you have any lab test that is abnormal or we need to change your treatment, we will call you to review the results.  Testing/Procedures: Your physician has requested that you have a cardiac catheterization 09/30/24. Cardiac catheterization is used to diagnose and/or treat various heart conditions. Doctors may recommend this procedure for a number of different reasons. The most common reason is to evaluate chest pain. Chest pain can be a symptom of coronary artery disease (CAD), and cardiac catheterization can show whether plaque is narrowing or blocking your heart's arteries. This procedure is also used to evaluate the valves, as well as measure the blood flow and oxygen levels in different parts of your heart. For further information please visit [email or web address] Please follow instruction sheet, as given.   Follow-Up: At [facility], you and your health needs are our priority.  As part of our continuing mission to provide you with exceptional heart care, our providers are all part of one team.  This team includes your primary Cardiologist (physician) and Advanced Practice Providers or APPs (Physician Assistants and Nurse Practitioners) who all work together to provide you with the care you need, when you need it.  Your next appointment:   Per structural heart team  Provider:   Lurena Red, MD or Izetta Hummer, PA-C    We recommend signing up for the patient portal called MyChart.  Sign up information is provided on this After Visit  Summary.  MyChart is used to connect with patients for Virtual Visits (Telemedicine).  Patients are able to view lab/test results, encounter notes, upcoming appointments, etc.  Non-urgent messages can be sent to your provider as well.   To learn more about what you can do with MyChart, go to [email or web address].   Other Instructions       Cardiac/Peripheral Catheterization   You are scheduled for a Cardiac Catheterization on Friday, November 7 with Dr. Lurena Red.  1. Please arrive at the [facility] (Main Entrance A) at [facility]: [address] [place], [place] [address] at 5:30 AM (This time is 2 hour(s) before your procedure to ensure your preparation). Your procedure is scheduled to begin at 7:30 AM.  Free valet parking service is available. You will check in at ADMITTING. The support person will be asked to wait in the waiting room.  It is OK to have someone drop you off and come back when you are ready to be discharged.        Special note: Every effort is made to have your procedure done on time. Please understand that emergencies sometimes delay scheduled procedures.  2. Diet: Nothing to eat after midnight.  3. Hydration:You need to be well hydrated before your procedure. On November 7, you may drink approved liquids (see below) until 2 hours before the procedure, with 16 oz of water  as your last intake.   List of approved liquids water , clear juice, clear tea, black coffee, fruit juices, non-citric and without pulp, carbonated beverages, Gatorade, Kool -Aid, plain Jello-O and plain ice popsicles.  4. Labs: You will need to have blood drawn on Wednesday, November 5 at [facility] (1st Floor), [address] [place], [place] [address]. You do not need to be fasting.  5. Medication instructions in preparation for your procedure:   Contrast Allergy: No  Stop taking, Losartan-hydrochlorothiazide  Thursday, November  6,  You can not take Sildenafil (Viagra) within 72 hours of your procedure due to the use of nitroglycerin during the procedure.   On the morning of your procedure, take Aspirin 81 mg and any morning medicines NOT listed above.  You may use sips of water .  6. Plan to go home the same day, you will only stay overnight if medically necessary. 7. You MUST have a responsible adult to drive you home. 8. An adult MUST be with you the first 24 hours after you arrive home. 9. Bring a current list of your medications, and the last time and date medication taken. 10. Bring ID and current insurance cards. 11.Please wear clothes that are easy to get on and off and wear slip-on shoes.  Thank you for allowing us  to care for you!   -- [facility]

## 2024-09-28 NOTE — Progress Notes (Signed)
 Pre Surgical Assessment: 5 M Walk Test  75M=16.10ft  5 Meter Walk Test- trial 1: 6.71 seconds 5 Meter Walk Test- trial 2: 5.66 seconds 5 Meter Walk Test- trial 3: 6 seconds 5 Meter Walk Test Average: 6.12 seconds

## 2024-09-29 ENCOUNTER — Ambulatory Visit: Payer: Self-pay | Admitting: Internal Medicine

## 2024-09-29 LAB — CBC
Hematocrit: 42.7 % (ref 37.5–51.0)
Hemoglobin: 13.9 g/dL (ref 13.0–17.7)
MCH: 30.3 pg (ref 26.6–33.0)
MCHC: 32.6 g/dL (ref 31.5–35.7)
MCV: 93 fL (ref 79–97)
Platelets: 163 x10E3/uL (ref 150–450)
RBC: 4.59 x10E6/uL (ref 4.14–5.80)
RDW: 13.9 % (ref 11.6–15.4)
WBC: 3.6 x10E3/uL (ref 3.4–10.8)

## 2024-09-29 LAB — BASIC METABOLIC PANEL WITH GFR
BUN/Creatinine Ratio: 17 (ref 10–24)
BUN: 19 mg/dL (ref 8–27)
CO2: 24 mmol/L (ref 20–29)
Calcium: 8.4 mg/dL — ABNORMAL LOW (ref 8.6–10.2)
Chloride: 102 mmol/L (ref 96–106)
Creatinine, Ser: 1.15 mg/dL (ref 0.76–1.27)
Glucose: 104 mg/dL — ABNORMAL HIGH (ref 70–99)
Potassium: 4.5 mmol/L (ref 3.5–5.2)
Sodium: 137 mmol/L (ref 134–144)
eGFR: 63 mL/min/1.73 (ref >59)

## 2024-09-29 MED ORDER — SODIUM CHLORIDE 0.9 % IV SOLN: 250.0000 mL | INTRAVENOUS | Status: DC | PRN

## 2024-09-29 MED ORDER — FREE WATER: 500.0000 mL | Freq: Once | Status: DC

## 2024-09-29 MED ORDER — SODIUM CHLORIDE 0.9% FLUSH: 3.0000 mL | Freq: Two times a day (BID) | INTRAVENOUS | Status: DC

## 2024-09-29 MED ORDER — ASPIRIN 81 MG PO CHEW: 81.0000 mg | CHEWABLE_TABLET | ORAL | Status: DC

## 2024-09-29 MED ORDER — SODIUM CHLORIDE 0.9% FLUSH: 3.0000 mL | INTRAVENOUS | Status: DC | PRN

## 2024-09-30 ENCOUNTER — Ambulatory Visit: Payer: Self-pay | Admitting: Internal Medicine

## 2024-09-30 ENCOUNTER — Other Ambulatory Visit: Payer: Self-pay

## 2024-09-30 ENCOUNTER — Encounter (HOSPITAL_COMMUNITY)
Admission: RE | Disposition: A | Discharge status: Home / Self Care | Payer: Self-pay | Source: Home / Self Care | Attending: Internal Medicine

## 2024-09-30 ENCOUNTER — Ambulatory Visit (HOSPITAL_COMMUNITY)
Admission: RE | Admit: 2024-09-30 | Discharge: 2024-09-30 | Disposition: A | Discharge status: Home / Self Care | Attending: Internal Medicine | Admitting: Internal Medicine

## 2024-09-30 DIAGNOSIS — I4719 Other supraventricular tachycardia: Secondary | ICD-10-CM | POA: Diagnosis not present

## 2024-09-30 DIAGNOSIS — Z7982 Long term (current) use of aspirin: Secondary | ICD-10-CM | POA: Diagnosis not present

## 2024-09-30 DIAGNOSIS — Z79899 Other long term (current) drug therapy: Secondary | ICD-10-CM | POA: Diagnosis not present

## 2024-09-30 DIAGNOSIS — I2510 Atherosclerotic heart disease of native coronary artery without angina pectoris: Secondary | ICD-10-CM | POA: Insufficient documentation

## 2024-09-30 DIAGNOSIS — I429 Cardiomyopathy, unspecified: Secondary | ICD-10-CM | POA: Diagnosis not present

## 2024-09-30 DIAGNOSIS — N182 Chronic kidney disease, stage 2 (mild): Secondary | ICD-10-CM | POA: Diagnosis not present

## 2024-09-30 DIAGNOSIS — E785 Hyperlipidemia, unspecified: Secondary | ICD-10-CM | POA: Diagnosis not present

## 2024-09-30 DIAGNOSIS — I350 Nonrheumatic aortic (valve) stenosis: Secondary | ICD-10-CM | POA: Diagnosis not present

## 2024-09-30 DIAGNOSIS — I129 Hypertensive chronic kidney disease with stage 1 through stage 4 chronic kidney disease, or unspecified chronic kidney disease: Secondary | ICD-10-CM | POA: Insufficient documentation

## 2024-09-30 HISTORY — PX: RIGHT HEART CATH AND CORONARY ANGIOGRAPHY: CATH118264

## 2024-09-30 LAB — POCT I-STAT EG7
Acid-base deficit: 2 mmol/L (ref 0.0–2.0)
Acid-base deficit: 4 mmol/L — ABNORMAL HIGH (ref 0.0–2.0)
Bicarbonate: 22 mmol/L (ref 20.0–28.0)
Bicarbonate: 23.1 mmol/L (ref 20.0–28.0)
Calcium, Ion: 1.06 mmol/L — ABNORMAL LOW (ref 1.15–1.40)
Calcium, Ion: 1.09 mmol/L — ABNORMAL LOW (ref 1.15–1.40)
HCT: 36 % — ABNORMAL LOW (ref 39.0–52.0)
HCT: 37 % — ABNORMAL LOW (ref 39.0–52.0)
Hemoglobin: 12.2 g/dL — ABNORMAL LOW (ref 13.0–17.0)
Hemoglobin: 12.6 g/dL — ABNORMAL LOW (ref 13.0–17.0)
O2 Saturation: 62 %
O2 Saturation: 72 %
Potassium: 3.6 mmol/L (ref 3.5–5.1)
Potassium: 3.8 mmol/L (ref 3.5–5.1)
Sodium: 137 mmol/L (ref 135–145)
Sodium: 141 mmol/L (ref 135–145)
TCO2: 23 mmol/L (ref 22–32)
TCO2: 24 mmol/L (ref 22–32)
pCO2, Ven: 40.3 mmHg — ABNORMAL LOW (ref 44–60)
pCO2, Ven: 40.6 mmHg — ABNORMAL LOW (ref 44–60)
pH, Ven: 7.342 (ref 7.25–7.43)
pH, Ven: 7.366 (ref 7.25–7.43)
pO2, Ven: 34 mmHg (ref 32–45)
pO2, Ven: 39 mmHg (ref 32–45)

## 2024-09-30 LAB — POCT I-STAT 7, (LYTES, BLD GAS, ICA,H+H)
Acid-base deficit: 3 mmol/L — ABNORMAL HIGH (ref 0.0–2.0)
Bicarbonate: 21.5 mmol/L (ref 20.0–28.0)
Calcium, Ion: 1.13 mmol/L — ABNORMAL LOW (ref 1.15–1.40)
HCT: 37 % — ABNORMAL LOW (ref 39.0–52.0)
Hemoglobin: 12.6 g/dL — ABNORMAL LOW (ref 13.0–17.0)
O2 Saturation: 91 %
Potassium: 3.9 mmol/L (ref 3.5–5.1)
Sodium: 140 mmol/L (ref 135–145)
TCO2: 22 mmol/L (ref 22–32)
pCO2 arterial: 34.3 mmHg (ref 32–48)
pH, Arterial: 7.404 (ref 7.35–7.45)
pO2, Arterial: 60 mmHg — ABNORMAL LOW (ref 83–108)

## 2024-09-30 SURGERY — RIGHT HEART CATH AND CORONARY ANGIOGRAPHY
Anesthesia: LOCAL

## 2024-09-30 MED ORDER — FREE WATER: 500.0000 mL | Freq: Once | Status: DC

## 2024-09-30 MED ORDER — HEPARIN SODIUM (PORCINE) 1000 UNIT/ML IJ SOLN
INTRAMUSCULAR | Status: AC
  Filled 2024-09-30: qty 10

## 2024-09-30 MED ORDER — HEPARIN SODIUM (PORCINE) 1000 UNIT/ML IJ SOLN
INTRAMUSCULAR | Status: AC
Start: 2024-09-30 — End: 2024-09-30
  Filled 2024-09-30: qty 10

## 2024-09-30 MED ORDER — ONDANSETRON HCL 4 MG/2ML IJ SOLN: 4.0000 mg | Freq: Four times a day (QID) | INTRAMUSCULAR | Status: DC | PRN

## 2024-09-30 MED ORDER — ACETAMINOPHEN 325 MG PO TABS: 650.0000 mg | ORAL_TABLET | ORAL | Status: DC | PRN

## 2024-09-30 MED ORDER — SODIUM CHLORIDE 0.9 % IV SOLN: 250.0000 mL | INTRAVENOUS | Status: DC | PRN

## 2024-09-30 MED ORDER — SODIUM CHLORIDE 0.9% FLUSH: 3.0000 mL | Freq: Two times a day (BID) | INTRAVENOUS | Status: DC

## 2024-09-30 MED ORDER — FENTANYL CITRATE (PF) 100 MCG/2ML IJ SOLN
INTRAMUSCULAR | Status: AC
  Filled 2024-09-30: qty 2

## 2024-09-30 MED ORDER — FENTANYL CITRATE (PF) 100 MCG/2ML IJ SOLN
INTRAMUSCULAR | Status: DC | PRN
  Administered 2024-09-30: 25 ug via INTRAVENOUS

## 2024-09-30 MED ORDER — IOHEXOL 350 MG/ML SOLN
INTRAVENOUS | Status: DC | PRN
  Administered 2024-09-30: 58 mL

## 2024-09-30 MED ORDER — HEPARIN SODIUM (PORCINE) 1000 UNIT/ML IJ SOLN
INTRAMUSCULAR | Status: DC | PRN
  Administered 2024-09-30: 5000 [IU] via INTRAVENOUS

## 2024-09-30 MED ORDER — VERAPAMIL HCL 2.5 MG/ML IV SOLN
INTRAVENOUS | Status: AC
  Filled 2024-09-30: qty 2

## 2024-09-30 MED ORDER — LIDOCAINE HCL (PF) 1 % IJ SOLN
INTRAMUSCULAR | Status: AC
  Filled 2024-09-30: qty 30

## 2024-09-30 MED ORDER — VERAPAMIL HCL 2.5 MG/ML IV SOLN
INTRAVENOUS | Status: AC
Start: 2024-09-30 — End: 2024-09-30
  Filled 2024-09-30: qty 2

## 2024-09-30 MED ORDER — LIDOCAINE HCL (PF) 1 % IJ SOLN
INTRAMUSCULAR | Status: DC | PRN
  Administered 2024-09-30 (×2): 2 mL

## 2024-09-30 MED ORDER — MIDAZOLAM HCL (PF) 2 MG/2ML IJ SOLN
INTRAMUSCULAR | Status: DC | PRN
  Administered 2024-09-30: 1 mg via INTRAVENOUS

## 2024-09-30 MED ORDER — VERAPAMIL HCL 2.5 MG/ML IV SOLN
INTRAVENOUS | Status: DC | PRN
  Administered 2024-09-30: 10 mL via INTRA_ARTERIAL

## 2024-09-30 MED ORDER — HEPARIN (PORCINE) IN NACL 1000-0.9 UT/500ML-% IV SOLN
INTRAVENOUS | Status: DC | PRN
  Administered 2024-09-30: 1000 mL

## 2024-09-30 MED ORDER — HYDRALAZINE HCL 20 MG/ML IJ SOLN
10.0000 mg | INTRAMUSCULAR | Status: DC | PRN
Start: 2024-09-30 — End: 2024-09-30

## 2024-09-30 MED ORDER — MIDAZOLAM HCL 2 MG/2ML IJ SOLN
INTRAMUSCULAR | Status: AC
Start: 2024-09-30 — End: 2024-09-30
  Filled 2024-09-30: qty 2

## 2024-09-30 MED ORDER — LABETALOL HCL 5 MG/ML IV SOLN: 10.0000 mg | INTRAVENOUS | Status: DC | PRN

## 2024-09-30 MED ORDER — SODIUM CHLORIDE 0.9% FLUSH
3.0000 mL | INTRAVENOUS | Status: DC | PRN
Start: 2024-09-30 — End: 2024-09-30

## 2024-09-30 SURGICAL SUPPLY — 12 items
CATH BALLN WEDGE 5F 110CM (CATHETERS) IMPLANT
CATH DIAG 6FR JL4 (CATHETERS) IMPLANT
CATH INFINITI 5FR ANG PIGTAIL (CATHETERS) IMPLANT
CATH INFINITI 6F FL3.5 (CATHETERS) IMPLANT
CATH INFINITI AMBI 6FR TG (CATHETERS) IMPLANT
DEVICE RAD COMP TR BAND LRG (VASCULAR PRODUCTS) IMPLANT
GLIDESHEATH SLEND SS 6F .021 (SHEATH) IMPLANT
KIT SYRINGE INJ CVI SPIKEX1 (MISCELLANEOUS) IMPLANT
PACK CARDIAC CATHETERIZATION (CUSTOM PROCEDURE TRAY) ×2 IMPLANT
SET ATX-X65L (MISCELLANEOUS) IMPLANT
SHEATH GLIDE SLENDER 4/5FR (SHEATH) IMPLANT
WIRE EMERALD 3MM-J .035X260CM (WIRE) IMPLANT

## 2024-09-30 NOTE — Interval H&P Note (Signed)
 History and Physical Interval Note:  09/30/2024 6:38 AM  Joshua Arroyo  has presented today for surgery, with the diagnosis of aortic stenosis.  The various methods of treatment have been discussed with the patient and family. After consideration of risks, benefits and other options for treatment, the patient has consented to  Procedure(s): RIGHT/LEFT HEART CATH AND CORONARY ANGIOGRAPHY (N/A) as a surgical intervention.  The patient's history has been reviewed, patient examined, no change in status, stable for surgery.  I have reviewed the patient's chart and labs.  Questions were answered to the patient's satisfaction.     [name]

## 2024-09-30 NOTE — Discharge Instructions (Signed)

## 2024-10-02 ENCOUNTER — Encounter (HOSPITAL_COMMUNITY): Payer: Self-pay | Admitting: Internal Medicine

## 2024-10-03 DIAGNOSIS — Z9889 Other specified postprocedural states: Secondary | ICD-10-CM | POA: Diagnosis not present

## 2024-10-03 DIAGNOSIS — I359 Nonrheumatic aortic valve disorder, unspecified: Secondary | ICD-10-CM | POA: Diagnosis not present

## 2024-10-03 DIAGNOSIS — I10 Essential (primary) hypertension: Secondary | ICD-10-CM | POA: Diagnosis not present

## 2024-10-03 DIAGNOSIS — R7301 Impaired fasting glucose: Secondary | ICD-10-CM | POA: Diagnosis not present

## 2024-10-03 DIAGNOSIS — N400 Enlarged prostate without lower urinary tract symptoms: Secondary | ICD-10-CM | POA: Diagnosis not present

## 2024-10-03 DIAGNOSIS — R4582 Worries: Secondary | ICD-10-CM | POA: Diagnosis not present

## 2024-10-03 DIAGNOSIS — G3184 Mild cognitive impairment, so stated: Secondary | ICD-10-CM | POA: Diagnosis not present

## 2024-10-04 ENCOUNTER — Other Ambulatory Visit: Payer: Self-pay

## 2024-10-04 DIAGNOSIS — I350 Nonrheumatic aortic (valve) stenosis: Secondary | ICD-10-CM

## 2024-10-17 ENCOUNTER — Ambulatory Visit (HOSPITAL_COMMUNITY)
Admission: RE | Admit: 2024-10-17 | Discharge: 2024-10-17 | Disposition: A | Discharge status: Home / Self Care | Source: Ambulatory Visit | Attending: Internal Medicine | Admitting: Internal Medicine

## 2024-10-17 ENCOUNTER — Ambulatory Visit: Payer: Self-pay | Admitting: Internal Medicine

## 2024-10-17 DIAGNOSIS — I700 Atherosclerosis of aorta: Secondary | ICD-10-CM | POA: Diagnosis not present

## 2024-10-17 DIAGNOSIS — Z01810 Encounter for preprocedural cardiovascular examination: Secondary | ICD-10-CM | POA: Diagnosis not present

## 2024-10-17 DIAGNOSIS — I2510 Atherosclerotic heart disease of native coronary artery without angina pectoris: Secondary | ICD-10-CM | POA: Diagnosis not present

## 2024-10-17 DIAGNOSIS — I7121 Aneurysm of the ascending aorta, without rupture: Secondary | ICD-10-CM | POA: Diagnosis not present

## 2024-10-17 DIAGNOSIS — I350 Nonrheumatic aortic (valve) stenosis: Secondary | ICD-10-CM | POA: Insufficient documentation

## 2024-10-17 DIAGNOSIS — N3289 Other specified disorders of bladder: Secondary | ICD-10-CM | POA: Diagnosis not present

## 2024-10-17 MED ORDER — IOHEXOL 350 MG/ML SOLN
100.0000 mL | Freq: Once | INTRAVENOUS | Status: AC | PRN
  Administered 2024-10-17: 100 mL via INTRAVENOUS

## 2024-10-17 NOTE — Progress Notes (Signed)
 Procedure Type: Isolated AVR Perioperative Outcome Estimate % Operative Mortality 2.93% Morbidity & Mortality 10.6% Stroke 1.02% Renal Failure 2.34% Reoperation 4.05% Prolonged Ventilation 5.71% Deep Sternal Wound Infection 0.056% Long Hospital Stay (>14 days) 5.71% Short Hospital Stay (<6 days)* 30.3%

## 2024-10-25 ENCOUNTER — Ambulatory Visit (HOSPITAL_COMMUNITY)

## 2024-10-25 DIAGNOSIS — B078 Other viral warts: Secondary | ICD-10-CM | POA: Diagnosis not present

## 2024-10-25 DIAGNOSIS — C44519 Basal cell carcinoma of skin of other part of trunk: Secondary | ICD-10-CM | POA: Diagnosis not present

## 2024-10-28 ENCOUNTER — Ambulatory Visit
Attending: Thoracic Surgery (Cardiothoracic Vascular Surgery) | Admitting: Thoracic Surgery (Cardiothoracic Vascular Surgery)

## 2024-10-28 VITALS — BP 154/89 | HR 62 | Resp 20 | Ht 72.0 in | Wt 214.6 lb

## 2024-10-28 DIAGNOSIS — I350 Nonrheumatic aortic (valve) stenosis: Secondary | ICD-10-CM | POA: Diagnosis not present

## 2024-10-28 NOTE — H&P (View-Only) (Signed)
 [address] [place] [address]             [phone]      Joshua Arroyo [facility] Medical Record #[id number] Date of Birth: 02/03/1941  Referring: Thukkani, Arun K, MD Primary Care: Joshua Darryle [name], DO Primary Cardiologist:None  Chief Complaint:    Chief Complaint  Patient presents with   Aortic Stenosis    TAVR consult, review all studies    History of Present Illness:     Joshua Arroyo is a 83 y.o. male presents for surgical evaluation of severe aortic stenosis.  He also has a history of hypertension and hyperlipidemia along with atrial flutter status post ablation.  He does complain of exertional fatigue and shortness of breath.  He has also had episodes of chest tightness.      Past Medical History:  Diagnosis Date   Atrial flutter (HCC)    post ablation   BPH (benign prostatic hyperplasia)    Hypercholesteremia    Hypertension     Past Surgical History:  Procedure Laterality Date   CARDIOVERSION     CERVICAL SPINE SURGERY  2011   RIGHT HEART CATH AND CORONARY ANGIOGRAPHY N/A 09/30/2024   Procedure: RIGHT HEART CATH AND CORONARY ANGIOGRAPHY;  Surgeon: Wendel Lurena POUR, MD;  Location: [facility] INVASIVE CV LAB;  Service: Cardiovascular;  Laterality: N/A;    Social History:  Social History   Tobacco Use  Smoking Status Never  Smokeless Tobacco Never    Social History   Substance and Sexual Activity  Alcohol Use Yes   Comment: Socially     No Known Allergies    Current Outpatient Medications  Medication Sig Dispense Refill   aspirin  EC 81 MG tablet Take 1 tablet by mouth daily.     Calcium-Vitamin D-Vitamin K (CALCIUM + D PO) Take by mouth. (Patient taking differently: Take 600 mg by mouth daily.)     Cholecalciferol (D3 HIGH POTENCY) 125 MCG (5000 UT) capsule Take 5,000 Units by mouth daily.     cyanocobalamin (VITAMIN B12) 1000 MCG tablet Take 1,000 mcg by mouth daily.     DiphenhydrAMINE HCl, Sleep, (NIGHTTIME  SLEEP AID) 50 MG tablet Take 50 mg by mouth at bedtime as needed for sleep.     finasteride (PROSCAR) 5 MG tablet Take 5 mg by mouth daily.  0   L-Leucine POWD Take by mouth daily.     losartan-hydrochlorothiazide (HYZAAR) 100-12.5 MG per tablet Take 1 tablet by mouth every morning.  0   LYSINE PO Take by mouth. (Patient taking differently: Take 5 g by mouth daily at 12 noon. Per patient taking 5 grams daily)     MAGNESIUM GLYCINATE PO Take 240 mg by mouth daily.     Melatonin 5 MG CHEW Chew by mouth daily. Gummie     olive oil external oil as needed.     Omega-3 Fatty Acids (FISH OIL) 1000 MG CAPS Take 980 mg by mouth daily.     OVER THE COUNTER MEDICATION  (Patient taking differently: Take 1 tablet by mouth daily at 12 noon. Per patient taking di-ASlpha Tecopheryl Acetate  180 mg taking daily)     tamsulosin (FLOMAX) 0.4 MG CAPS Take 0.4 mg by mouth daily.     vitamin E 180 MG (400 UNITS) capsule Take 400 Units by mouth daily.     No current facility-administered medications for this visit.    (Not in  a hospital admission)   Family History  Problem Relation Age of Onset   Heart Problems Father      Review of Systems:   Review of Systems  Constitutional:  Positive for malaise/fatigue.  Respiratory:  Positive for shortness of breath.   Cardiovascular:  Positive for chest pain and palpitations.  Neurological:  Positive for dizziness.      Physical Exam: BP (!) 154/89 (BP Location: Right Arm, Patient Position: Sitting, Cuff Size: Normal)   Pulse 62   Resp 20   Ht 6' (1.829 m)   Wt 214 lb 9.6 oz (97.3 kg)   SpO2 97% Comment: RA  BMI 29.10 kg/m  Physical Exam Constitutional:      General: He is not in acute distress.    Appearance: He is not ill-appearing.  HENT:     Head: Normocephalic and atraumatic.  Eyes:     Extraocular Movements: Extraocular movements intact.  Cardiovascular:     Rate and Rhythm: Normal rate.  Pulmonary:     Effort: Pulmonary effort is normal.  No respiratory distress.  Abdominal:     General: Abdomen is flat. There is no distension.  Musculoskeletal:        General: Normal range of motion.     Cervical back: Normal range of motion.  Skin:    General: Skin is warm and dry.  Neurological:     General: No focal deficit present.     Mental Status: He is alert and oriented to person, place, and time.       Cardiac Studies & Procedures   ______________________________________________________________________________________________ CARDIAC CATHETERIZATION  CARDIAC CATHETERIZATION 09/30/2024  Conclusion 1.  Minimal, nonobstructive coronary artery disease. 2.  Fick cardiac output of 5.3 L/min and Fick cardiac index of 2.4 L/min/m with the following hemodynamics: Right atrial pressure mean of 8 mmHg RV pressure of 39/-1 with an end-diastolic pressure of 10 mmHg Wedge pressure mean of 20 mmHg with V waves to 24 mmHg PA pressure of 38/18 with a mean of 27 mmHg PVR of 1.3 Woods units PA pulsatility index of 2.5 3.  Capacious iliofemoral vessels bilaterally.  Recommendation: Continue evaluation for aortic valve intervention.  Results reviewed with patient's wife Donny over phone.  Findings Coronary Findings Diagnostic  Dominance: Right  Left Anterior Descending The vessel exhibits minimal luminal irregularities.  Left Circumflex The vessel exhibits minimal luminal irregularities.  Right Coronary Artery The vessel exhibits minimal luminal irregularities.  Intervention  No interventions have been documented.     ECHOCARDIOGRAM  ECHOCARDIOGRAM COMPLETE 09/20/2024  Narrative ECHOCARDIOGRAM REPORT    Patient Name:   Joshua Arroyo Date of Exam: 09/20/2024 Medical Rec #:  [id number]      Height:       72.0 in Accession #:    [id number]     Weight:       206.6 lb Date of Birth:  08-02-1941      BSA:          2.160 m Patient Age:    83 years       BP:           130/85 mmHg Patient Gender: M              HR:            85 bpm. Exam Location:  Outpatient  Procedure: 2D Echo, 3D Echo, Cardiac Doppler, Color Doppler and Strain Analysis (Both Spectral and Color Flow Doppler were utilized during procedure).  Indications:  I35.0 Nonrheumatic aortic (valve) stenosis  History:        Patient has prior history of Echocardiogram examinations, most recent 05/21/2022. Abnormal ECG, Arrythmias:Tachycardia, Atach and Atrial Flutter; Risk Factors:Hypertension and Dyslipidemia. Ablation.  Sonographer:    Ellouise Mose RDCS Referring Phys: [id number] OZELL BARTER [facility]  IMPRESSIONS   1. Left ventricular ejection fraction, by estimation, is 40 to 45%. The left ventricle has mildly decreased function. The left ventricle demonstrates global hypokinesis. The left ventricular internal cavity size was mildly dilated. There is mild concentric left ventricular hypertrophy. Left ventricular diastolic parameters are indeterminate. The average left ventricular global longitudinal strain is -13.4 %. The global longitudinal strain is abnormal. 2. Right ventricular systolic function is mildly reduced. The right ventricular size is mildly enlarged. There is normal pulmonary artery systolic pressure. 3. Left atrial size was mildly dilated. 4. Right atrial size was mildly dilated. 5. The mitral valve is grossly normal. Mild to moderate mitral valve regurgitation. No evidence of mitral stenosis. 6. The aortic valve is calcified. There is severe calcifcation of the aortic valve. There is severe thickening of the aortic valve. Aortic valve regurgitation is mild. Severe aortic valve stenosis. Aortic valve area, by VTI measures 0.95 cm. Aortic valve mean gradient measures 38.0 mmHg. Aortic valve Vmax measures 4.14 m/s. 7. Aortic dilatation noted. There is mild dilatation of the ascending aorta, measuring 43 mm. 8. The inferior vena cava is dilated in size with <50% respiratory variability, suggesting right atrial pressure of 15  mmHg.  FINDINGS Left Ventricle: Left ventricular ejection fraction, by estimation, is 40 to 45%. The left ventricle has mildly decreased function. The left ventricle demonstrates global hypokinesis. The average left ventricular global longitudinal strain is -13.4 %. Strain was performed and the global longitudinal strain is abnormal. The left ventricular internal cavity size was mildly dilated. There is mild concentric left ventricular hypertrophy. Left ventricular diastolic parameters are indeterminate.  Right Ventricle: The right ventricular size is mildly enlarged. No increase in right ventricular wall thickness. Right ventricular systolic function is mildly reduced. There is normal pulmonary artery systolic pressure. The tricuspid regurgitant velocity is 2.24 m/s, and with an assumed right atrial pressure of 15 mmHg, the estimated right ventricular systolic pressure is 35.1 mmHg.  Left Atrium: Left atrial size was mildly dilated.  Right Atrium: Right atrial size was mildly dilated.  Pericardium: There is no evidence of pericardial effusion.  Mitral Valve: The mitral valve is grossly normal. Mild to moderate mitral valve regurgitation. No evidence of mitral valve stenosis.  Tricuspid Valve: The tricuspid valve is normal in structure. Tricuspid valve regurgitation is trivial. No evidence of tricuspid stenosis.  Aortic Valve: The aortic valve is calcified. There is severe calcifcation of the aortic valve. There is severe thickening of the aortic valve. Aortic valve regurgitation is mild. Severe aortic stenosis is present. Aortic valve mean gradient measures 38.0 mmHg. Aortic valve peak gradient measures 68.6 mmHg. Aortic valve area, by VTI measures 0.95 cm.  Pulmonic Valve: The pulmonic valve was normal in structure. Pulmonic valve regurgitation is not visualized. No evidence of pulmonic stenosis.  Aorta: Aortic dilatation noted. There is mild dilatation of the ascending aorta, measuring 43  mm.  Venous: The inferior vena cava is dilated in size with less than 50% respiratory variability, suggesting right atrial pressure of 15 mmHg.  IAS/Shunts: No atrial level shunt detected by color flow Doppler.  Additional Comments: 3D was performed not requiring image post processing on an independent workstation and was abnormal.  LEFT VENTRICLE PLAX 2D LVIDd:         4.80 cm LVIDs:         3.30 cm      2D Longitudinal Strain LV PW:         1.20 cm      2D Strain GLS Avg:     -13.4 % LV IVS:        1.40 cm LVOT diam:     2.40 cm LV SV:         73 LV SV Index:   34 LVOT Area:     4.52 cm LV IVRT:       116 msec  LV Volumes (MOD) LV vol d, MOD A2C: 126.0 ml LV vol d, MOD A4C: 117.0 ml LV vol s, MOD A2C: 63.4 ml LV vol s, MOD A4C: 59.6 ml LV SV MOD A2C:     62.6 ml LV SV MOD A4C:     117.0 ml LV SV MOD BP:      61.4 ml  RIGHT VENTRICLE             IVC RV S prime:     11.30 cm/s  IVC diam: 2.90 cm TAPSE (M-mode): 1.7 cm  LEFT ATRIUM           Index        RIGHT ATRIUM           Index LA diam:      3.90 cm 1.81 cm/m   RA Area:     25.00 cm LA Vol (A2C): 29.0 ml 13.42 ml/m  RA Volume:   75.90 ml  35.14 ml/m LA Vol (A4C): 48.7 ml 22.54 ml/m AORTIC VALVE AV Area (Vmax):    0.89 cm AV Area (Vmean):   0.98 cm AV Area (VTI):     0.95 cm AV Vmax:           414.00 cm/s AV Vmean:          262.800 cm/s AV VTI:            0.768 m AV Peak Grad:      68.6 mmHg AV Mean Grad:      38.0 mmHg LVOT Vmax:         81.80 cm/s LVOT Vmean:        57.100 cm/s LVOT VTI:          0.162 m LVOT/AV VTI ratio: 0.21  AORTA Ao Root diam: 3.70 cm Ao Asc diam:  4.30 cm  MITRAL VALVE                TRICUSPID VALVE MV Area (PHT): 10.25 cm    TR Peak grad:   20.1 mmHg MV Decel Time: 74 msec      TR Vmax:        224.00 cm/s MV E velocity: 122.00 cm/s SHUNTS Systemic VTI:  0.16 m Systemic Diam: 2.40 cm  Morene Brownie Electronically signed by Morene Brownie Signature Date/Time:  09/20/2024/4:56:49 PM    Final      CT SCANS  CT CORONARY MORPH W/CTA COR W/SCORE 10/17/2024  Narrative CLINICAL DATA:  Aortic valve replacement (TAVR), pre-op eval  EXAM: Cardiac TAVR CT  TECHNIQUE: A non-contrast, gated CT scan was obtained with axial slices of 2.5 mm through the heart for aortic valve scoring. A 120 kV retrospective, gated, contrast cardiac scan was obtained. Gantry rotation speed was 230 msec and collimation was 0.63 mm. Nitroglycerin was not given. A delayed  scan was obtained to exclude left atrial appendage thrombus. The 3D dataset was reconstructed in systole with motion correction. The 3D data set was reconstructed in 5% intervals of the 0-95% of the R-R cycle. Systolic and diastolic phases were analyzed on a dedicated workstation using MPR, MIP, and VRT modes. The patient received 100 cc of contrast.  FINDINGS: Aortic Valve:  Possible tricuspid aortic valve vs forme fruste bicuspid aortic valve with partial fusion of right and non-coronary cusps, with severely reduced cusp excursion. Severely thickened and severely calcified aortic valve cusps.  AV calcium score: 6679  Virtual Basal Annulus Measurements:  Maximum/Minimum Diameter: 31.9 x 26.8 mm  Perimeter:  90 mm  Area:  629 mm2  Mild LVOT calcifications.  Membranous septal length: 7.7 mm  Based on these measurements, the annulus would be suitable for a 29 mm Sapien 3 valve. Alternatively, Heart Team can consider 34 mm Evolut valve. Recommend Heart Team discussion for valve selection.  Sinus of Valsalva Measurements:  Non-coronary:  39 mm  Right - coronary:  38 mm  Left - coronary:  44 mm  Asymmetric sinus double oblique measurements 43 x 32 mm.  Coronary height and sinus of Valsalva Height:  Left main: 20 mm, Left sinus: 30 mm  Right coronary: 25 mm, Right sinus: 28.3 mm  Aorta: Conventional 3 vessel branch pattern of aortic arch.  Sinotubular Junction:  38  mm  Ascending Thoracic Aorta:  43 mm  Aortic Arch:  38 mm  Descending Thoracic Aorta:  32 mm  Coronary Arteries: Normal coronary origin. Right dominance. The study was performed without use of NTG and insufficient for plaque evaluation.  OTHER:  Atria: Mild biatrial dilation  Left atrial appendage: No thrombus.  Mitral valve: Grossly normal, no mitral annular calcifications.  Pulmonary artery: Mild dilation main PA, 31 mm.  Pulmonary veins: Normal anatomy.  IMPRESSION: 1. Possible tricuspid aortic valve vs forme fruste bicuspid aortic valve with partial fusion of right and non-coronary cusps, with severely reduced cusp excursion. Severely thickened and severely calcified aortic valve cusps. 2. Aortic valve calcium score: 6679 3. Annulus area: 629 mm2, suitable for 29 mm Sapien 3 valve. Mild LVOT calcifications. Membranous septal length 7.7 mm. 4. Sufficient coronary artery heights from annulus. 5. Optimum fluoroscopic angle for delivery: LAO 10 CAU 8 6. Mild ascending aorta dilation, 43 mm mid ascending aorta and 44 mm at sinus of Valsalva.   Electronically Signed By: Soyla Merck M.D. On: 10/17/2024 12:40     ______________________________________________________________________________________________      ECG Sinus with first-degree AV block    I have independently reviewed the above radiologic studies and discussed with the patient   Recent Lab Findings:  Assessment / Plan:   83 y.o. male with severe aortic stenosis.  STS score: 2.93.  NYHA Class 2.  The risks and benefits of transfemoral TAVR were discussed in detail.  We also discussed possibility of an emergent sternotomy to address any procedural complications.  Based on our discussion, we collectively decided that an emergent sternotomy would be indicated.  The patient is agreeable to proceed.  Based on my review of her LHC, echo, and CTA, I agree with the multidisciplinary plan to proceed with a 29  mm SAPIEN TAVR.      I  spent 40 minutes counseling the patient face to face.   Linnie MALVA [name] 10/28/2024 4:06 PM

## 2024-10-28 NOTE — Progress Notes (Signed)
 [address] [place] [address]             [phone]      Joshua Arroyo [facility] Medical Record #[id number] Date of Birth: 02/03/1941  Referring: Thukkani, Arun K, MD Primary Care: Joshua Arroyo [name], DO Primary Cardiologist:None  Chief Complaint:    Chief Complaint  Patient presents with   Aortic Stenosis    TAVR consult, review all studies    History of Present Illness:     Joshua Arroyo is a 83 y.o. male presents for surgical evaluation of severe aortic stenosis.  He also has a history of hypertension and hyperlipidemia along with atrial flutter status post ablation.  He does complain of exertional fatigue and shortness of breath.  He has also had episodes of chest tightness.      Past Medical History:  Diagnosis Date   Atrial flutter (HCC)    post ablation   BPH (benign prostatic hyperplasia)    Hypercholesteremia    Hypertension     Past Surgical History:  Procedure Laterality Date   CARDIOVERSION     CERVICAL SPINE SURGERY  2011   RIGHT HEART CATH AND CORONARY ANGIOGRAPHY N/A 09/30/2024   Procedure: RIGHT HEART CATH AND CORONARY ANGIOGRAPHY;  Surgeon: Joshua Lurena POUR, MD;  Location: [facility] INVASIVE CV LAB;  Service: Cardiovascular;  Laterality: N/A;    Social History:  Social History   Tobacco Use  Smoking Status Never  Smokeless Tobacco Never    Social History   Substance and Sexual Activity  Alcohol Use Yes   Comment: Socially     No Known Allergies    Current Outpatient Medications  Medication Sig Dispense Refill   aspirin  EC 81 MG tablet Take 1 tablet by mouth daily.     Calcium-Vitamin D-Vitamin Arroyo (CALCIUM + D PO) Take by mouth. (Patient taking differently: Take 600 mg by mouth daily.)     Cholecalciferol (D3 HIGH POTENCY) 125 MCG (5000 UT) capsule Take 5,000 Units by mouth daily.     cyanocobalamin (VITAMIN B12) 1000 MCG tablet Take 1,000 mcg by mouth daily.     DiphenhydrAMINE HCl, Sleep, (NIGHTTIME  SLEEP AID) 50 MG tablet Take 50 mg by mouth at bedtime as needed for sleep.     finasteride (PROSCAR) 5 MG tablet Take 5 mg by mouth daily.  0   L-Leucine POWD Take by mouth daily.     losartan-hydrochlorothiazide (HYZAAR) 100-12.5 MG per tablet Take 1 tablet by mouth every morning.  0   LYSINE PO Take by mouth. (Patient taking differently: Take 5 g by mouth daily at 12 noon. Per patient taking 5 grams daily)     MAGNESIUM GLYCINATE PO Take 240 mg by mouth daily.     Melatonin 5 MG CHEW Chew by mouth daily. Gummie     olive oil external oil as needed.     Omega-3 Fatty Acids (FISH OIL) 1000 MG CAPS Take 980 mg by mouth daily.     OVER THE COUNTER MEDICATION  (Patient taking differently: Take 1 tablet by mouth daily at 12 noon. Per patient taking di-ASlpha Tecopheryl Acetate  180 mg taking daily)     tamsulosin (FLOMAX) 0.4 MG CAPS Take 0.4 mg by mouth daily.     vitamin E 180 MG (400 UNITS) capsule Take 400 Units by mouth daily.     No current facility-administered medications for this visit.    (Not in  a hospital admission)   Family History  Problem Relation Age of Onset   Heart Problems Father      Review of Systems:   Review of Systems  Constitutional:  Positive for malaise/fatigue.  Respiratory:  Positive for shortness of breath.   Cardiovascular:  Positive for chest pain and palpitations.  Neurological:  Positive for dizziness.      Physical Exam: BP (!) 154/89 (BP Location: Right Arm, Patient Position: Sitting, Cuff Size: Normal)   Pulse 62   Resp 20   Ht 6' (1.829 m)   Wt 214 lb 9.6 oz (97.3 kg)   SpO2 97% Comment: RA  BMI 29.10 kg/m  Physical Exam Constitutional:      General: He is not in acute distress.    Appearance: He is not ill-appearing.  HENT:     Head: Normocephalic and atraumatic.  Eyes:     Extraocular Movements: Extraocular movements intact.  Cardiovascular:     Rate and Rhythm: Normal rate.  Pulmonary:     Effort: Pulmonary effort is normal.  No respiratory distress.  Abdominal:     General: Abdomen is flat. There is no distension.  Musculoskeletal:        General: Normal range of motion.     Cervical back: Normal range of motion.  Skin:    General: Skin is warm and dry.  Neurological:     General: No focal deficit present.     Mental Status: He is alert and oriented to person, place, and time.       Cardiac Studies & Procedures   ______________________________________________________________________________________________ CARDIAC CATHETERIZATION  CARDIAC CATHETERIZATION 09/30/2024  Conclusion 1.  Minimal, nonobstructive coronary artery disease. 2.  Fick cardiac output of 5.3 L/min and Fick cardiac index of 2.4 L/min/m with the following hemodynamics: Right atrial pressure mean of 8 mmHg RV pressure of 39/-1 with an end-diastolic pressure of 10 mmHg Wedge pressure mean of 20 mmHg with V waves to 24 mmHg PA pressure of 38/18 with a mean of 27 mmHg PVR of 1.3 Woods units PA pulsatility index of 2.5 3.  Capacious iliofemoral vessels bilaterally.  Recommendation: Continue evaluation for aortic valve intervention.  Results reviewed with patient's wife Joshua Arroyo over phone.  Findings Coronary Findings Diagnostic  Dominance: Right  Left Anterior Descending The vessel exhibits minimal luminal irregularities.  Left Circumflex The vessel exhibits minimal luminal irregularities.  Right Coronary Artery The vessel exhibits minimal luminal irregularities.  Intervention  No interventions have been documented.     ECHOCARDIOGRAM  ECHOCARDIOGRAM COMPLETE 09/20/2024  Narrative ECHOCARDIOGRAM REPORT    Patient Name:   Joshua Arroyo Date of Exam: 09/20/2024 Medical Rec #:  [id number]      Height:       72.0 in Accession #:    [id number]     Weight:       206.6 lb Date of Birth:  08-02-1941      BSA:          2.160 m Patient Age:    83 years       BP:           130/85 mmHg Patient Gender: M              HR:            85 bpm. Exam Location:  Outpatient  Procedure: 2D Echo, 3D Echo, Cardiac Doppler, Color Doppler and Strain Analysis (Both Spectral and Color Flow Doppler were utilized during procedure).  Indications:  I35.0 Nonrheumatic aortic (valve) stenosis  History:        Patient has prior history of Echocardiogram examinations, most recent 05/21/2022. Abnormal ECG, Arrythmias:Tachycardia, Atach and Atrial Flutter; Risk Factors:Hypertension and Dyslipidemia. Ablation.  Sonographer:    Ellouise Mose RDCS Referring Phys: [id number] OZELL BARTER [facility]  IMPRESSIONS   1. Left ventricular ejection fraction, by estimation, is 40 to 45%. The left ventricle has mildly decreased function. The left ventricle demonstrates global hypokinesis. The left ventricular internal cavity size was mildly dilated. There is mild concentric left ventricular hypertrophy. Left ventricular diastolic parameters are indeterminate. The average left ventricular global longitudinal strain is -13.4 %. The global longitudinal strain is abnormal. 2. Right ventricular systolic function is mildly reduced. The right ventricular size is mildly enlarged. There is normal pulmonary artery systolic pressure. 3. Left atrial size was mildly dilated. 4. Right atrial size was mildly dilated. 5. The mitral valve is grossly normal. Mild to moderate mitral valve regurgitation. No evidence of mitral stenosis. 6. The aortic valve is calcified. There is severe calcifcation of the aortic valve. There is severe thickening of the aortic valve. Aortic valve regurgitation is mild. Severe aortic valve stenosis. Aortic valve area, by VTI measures 0.95 cm. Aortic valve mean gradient measures 38.0 mmHg. Aortic valve Vmax measures 4.14 m/s. 7. Aortic dilatation noted. There is mild dilatation of the ascending aorta, measuring 43 mm. 8. The inferior vena cava is dilated in size with <50% respiratory variability, suggesting right atrial pressure of 15  mmHg.  FINDINGS Left Ventricle: Left ventricular ejection fraction, by estimation, is 40 to 45%. The left ventricle has mildly decreased function. The left ventricle demonstrates global hypokinesis. The average left ventricular global longitudinal strain is -13.4 %. Strain was performed and the global longitudinal strain is abnormal. The left ventricular internal cavity size was mildly dilated. There is mild concentric left ventricular hypertrophy. Left ventricular diastolic parameters are indeterminate.  Right Ventricle: The right ventricular size is mildly enlarged. No increase in right ventricular wall thickness. Right ventricular systolic function is mildly reduced. There is normal pulmonary artery systolic pressure. The tricuspid regurgitant velocity is 2.24 m/s, and with an assumed right atrial pressure of 15 mmHg, the estimated right ventricular systolic pressure is 35.1 mmHg.  Left Atrium: Left atrial size was mildly dilated.  Right Atrium: Right atrial size was mildly dilated.  Pericardium: There is no evidence of pericardial effusion.  Mitral Valve: The mitral valve is grossly normal. Mild to moderate mitral valve regurgitation. No evidence of mitral valve stenosis.  Tricuspid Valve: The tricuspid valve is normal in structure. Tricuspid valve regurgitation is trivial. No evidence of tricuspid stenosis.  Aortic Valve: The aortic valve is calcified. There is severe calcifcation of the aortic valve. There is severe thickening of the aortic valve. Aortic valve regurgitation is mild. Severe aortic stenosis is present. Aortic valve mean gradient measures 38.0 mmHg. Aortic valve peak gradient measures 68.6 mmHg. Aortic valve area, by VTI measures 0.95 cm.  Pulmonic Valve: The pulmonic valve was normal in structure. Pulmonic valve regurgitation is not visualized. No evidence of pulmonic stenosis.  Aorta: Aortic dilatation noted. There is mild dilatation of the ascending aorta, measuring 43  mm.  Venous: The inferior vena cava is dilated in size with less than 50% respiratory variability, suggesting right atrial pressure of 15 mmHg.  IAS/Shunts: No atrial level shunt detected by color flow Doppler.  Additional Comments: 3D was performed not requiring image post processing on an independent workstation and was abnormal.  LEFT VENTRICLE PLAX 2D LVIDd:         4.80 cm LVIDs:         3.30 cm      2D Longitudinal Strain LV PW:         1.20 cm      2D Strain GLS Avg:     -13.4 % LV IVS:        1.40 cm LVOT diam:     2.40 cm LV SV:         73 LV SV Index:   34 LVOT Area:     4.52 cm LV IVRT:       116 msec  LV Volumes (MOD) LV vol d, MOD A2C: 126.0 ml LV vol d, MOD A4C: 117.0 ml LV vol s, MOD A2C: 63.4 ml LV vol s, MOD A4C: 59.6 ml LV SV MOD A2C:     62.6 ml LV SV MOD A4C:     117.0 ml LV SV MOD BP:      61.4 ml  RIGHT VENTRICLE             IVC RV S prime:     11.30 cm/s  IVC diam: 2.90 cm TAPSE (M-mode): 1.7 cm  LEFT ATRIUM           Index        RIGHT ATRIUM           Index LA diam:      3.90 cm 1.81 cm/m   RA Area:     25.00 cm LA Vol (A2C): 29.0 ml 13.42 ml/m  RA Volume:   75.90 ml  35.14 ml/m LA Vol (A4C): 48.7 ml 22.54 ml/m AORTIC VALVE AV Area (Vmax):    0.89 cm AV Area (Vmean):   0.98 cm AV Area (VTI):     0.95 cm AV Vmax:           414.00 cm/s AV Vmean:          262.800 cm/s AV VTI:            0.768 m AV Peak Grad:      68.6 mmHg AV Mean Grad:      38.0 mmHg LVOT Vmax:         81.80 cm/s LVOT Vmean:        57.100 cm/s LVOT VTI:          0.162 m LVOT/AV VTI ratio: 0.21  AORTA Ao Root diam: 3.70 cm Ao Asc diam:  4.30 cm  MITRAL VALVE                TRICUSPID VALVE MV Area (PHT): 10.25 cm    TR Peak grad:   20.1 mmHg MV Decel Time: 74 msec      TR Vmax:        224.00 cm/s MV E velocity: 122.00 cm/s SHUNTS Systemic VTI:  0.16 m Systemic Diam: 2.40 cm  Morene Brownie Electronically signed by Morene Brownie Signature Date/Time:  09/20/2024/4:56:49 PM    Final      CT SCANS  CT CORONARY MORPH W/CTA COR W/SCORE 10/17/2024  Narrative CLINICAL DATA:  Aortic valve replacement (TAVR), pre-op eval  EXAM: Cardiac TAVR CT  TECHNIQUE: A non-contrast, gated CT scan was obtained with axial slices of 2.5 mm through the heart for aortic valve scoring. A 120 kV retrospective, gated, contrast cardiac scan was obtained. Gantry rotation speed was 230 msec and collimation was 0.63 mm. Nitroglycerin was not given. A delayed  scan was obtained to exclude left atrial appendage thrombus. The 3D dataset was reconstructed in systole with motion correction. The 3D data set was reconstructed in 5% intervals of the 0-95% of the R-R cycle. Systolic and diastolic phases were analyzed on a dedicated workstation using MPR, MIP, and VRT modes. The patient received 100 cc of contrast.  FINDINGS: Aortic Valve:  Possible tricuspid aortic valve vs forme fruste bicuspid aortic valve with partial fusion of right and non-coronary cusps, with severely reduced cusp excursion. Severely thickened and severely calcified aortic valve cusps.  AV calcium score: 6679  Virtual Basal Annulus Measurements:  Maximum/Minimum Diameter: 31.9 x 26.8 mm  Perimeter:  90 mm  Area:  629 mm2  Mild LVOT calcifications.  Membranous septal length: 7.7 mm  Based on these measurements, the annulus would be suitable for a 29 mm Sapien 3 valve. Alternatively, Heart Team can consider 34 mm Evolut valve. Recommend Heart Team discussion for valve selection.  Sinus of Valsalva Measurements:  Non-coronary:  39 mm  Right - coronary:  38 mm  Left - coronary:  44 mm  Asymmetric sinus double oblique measurements 43 x 32 mm.  Coronary height and sinus of Valsalva Height:  Left main: 20 mm, Left sinus: 30 mm  Right coronary: 25 mm, Right sinus: 28.3 mm  Aorta: Conventional 3 vessel branch pattern of aortic arch.  Sinotubular Junction:  38  mm  Ascending Thoracic Aorta:  43 mm  Aortic Arch:  38 mm  Descending Thoracic Aorta:  32 mm  Coronary Arteries: Normal coronary origin. Right dominance. The study was performed without use of NTG and insufficient for plaque evaluation.  OTHER:  Atria: Mild biatrial dilation  Left atrial appendage: No thrombus.  Mitral valve: Grossly normal, no mitral annular calcifications.  Pulmonary artery: Mild dilation main PA, 31 mm.  Pulmonary veins: Normal anatomy.  IMPRESSION: 1. Possible tricuspid aortic valve vs forme fruste bicuspid aortic valve with partial fusion of right and non-coronary cusps, with severely reduced cusp excursion. Severely thickened and severely calcified aortic valve cusps. 2. Aortic valve calcium score: 6679 3. Annulus area: 629 mm2, suitable for 29 mm Sapien 3 valve. Mild LVOT calcifications. Membranous septal length 7.7 mm. 4. Sufficient coronary artery heights from annulus. 5. Optimum fluoroscopic angle for delivery: LAO 10 CAU 8 6. Mild ascending aorta dilation, 43 mm mid ascending aorta and 44 mm at sinus of Valsalva.   Electronically Signed By: Soyla Merck M.D. On: 10/17/2024 12:40     ______________________________________________________________________________________________      ECG Sinus with first-degree AV block    I have independently reviewed the above radiologic studies and discussed with the patient   Recent Lab Findings:  Assessment / Plan:   83 y.o. male with severe aortic stenosis.  STS score: 2.93.  NYHA Class 2.  The risks and benefits of transfemoral TAVR were discussed in detail.  We also discussed possibility of an emergent sternotomy to address any procedural complications.  Based on our discussion, we collectively decided that an emergent sternotomy would be indicated.  The patient is agreeable to proceed.  Based on my review of her LHC, echo, and CTA, I agree with the multidisciplinary plan to proceed with a 29  mm SAPIEN TAVR.      I  spent 40 minutes counseling the patient face to face.   Linnie MALVA [name] 10/28/2024 4:06 PM

## 2024-10-31 ENCOUNTER — Other Ambulatory Visit: Payer: Self-pay

## 2024-10-31 DIAGNOSIS — I350 Nonrheumatic aortic (valve) stenosis: Secondary | ICD-10-CM

## 2024-11-03 ENCOUNTER — Encounter (HOSPITAL_COMMUNITY): Payer: Self-pay

## 2024-11-04 ENCOUNTER — Inpatient Hospital Stay (HOSPITAL_COMMUNITY): Admission: RE | Admit: 2024-11-04 | Discharge: 2024-11-04 | Attending: Cardiovascular Disease

## 2024-11-04 ENCOUNTER — Ambulatory Visit: Payer: Self-pay | Admitting: Physician Assistant

## 2024-11-04 ENCOUNTER — Ambulatory Visit (HOSPITAL_COMMUNITY)
Admission: RE | Admit: 2024-11-04 | Discharge: 2024-11-04 | Disposition: A | Source: Ambulatory Visit | Attending: Cardiovascular Disease | Admitting: Cardiovascular Disease

## 2024-11-04 DIAGNOSIS — I350 Nonrheumatic aortic (valve) stenosis: Secondary | ICD-10-CM

## 2024-11-04 DIAGNOSIS — Z01818 Encounter for other preprocedural examination: Secondary | ICD-10-CM

## 2024-11-04 LAB — COMPREHENSIVE METABOLIC PANEL WITH GFR
ALT: 17 U/L (ref 0–44)
AST: 22 U/L (ref 15–41)
Albumin: 3.8 g/dL (ref 3.5–5.0)
Alkaline Phosphatase: 44 U/L (ref 38–126)
Anion gap: 6 (ref 5–15)
BUN: 15 mg/dL (ref 8–23)
CO2: 29 mmol/L (ref 22–32)
Calcium: 8.3 mg/dL — ABNORMAL LOW (ref 8.9–10.3)
Chloride: 104 mmol/L (ref 98–111)
Creatinine, Ser: 1.07 mg/dL (ref 0.61–1.24)
GFR, Estimated: >60 mL/min (ref >60)
Glucose, Bld: 103 mg/dL — ABNORMAL HIGH (ref 70–99)
Potassium: 4.6 mmol/L (ref 3.5–5.1)
Sodium: 139 mmol/L (ref 135–145)
Total Bilirubin: 0.9 mg/dL (ref 0.0–1.2)
Total Protein: 6.6 g/dL (ref 6.5–8.1)

## 2024-11-04 LAB — URINALYSIS, ROUTINE W REFLEX MICROSCOPIC
Bilirubin Urine: NEGATIVE
Glucose, UA: NEGATIVE mg/dL
Hgb urine dipstick: NEGATIVE
Ketones, ur: NEGATIVE mg/dL
Nitrite: NEGATIVE
Protein, ur: NEGATIVE mg/dL
Specific Gravity, Urine: 1.011 (ref 1.005–1.030)
WBC, UA: >50 WBC/hpf (ref 0–5)
pH: 6 (ref 5.0–8.0)

## 2024-11-04 LAB — CBC
HCT: 44.6 % (ref 39.0–52.0)
Hemoglobin: 14.9 g/dL (ref 13.0–17.0)
MCH: 30.8 pg (ref 26.0–34.0)
MCHC: 33.4 g/dL (ref 30.0–36.0)
MCV: 92.3 fL (ref 80.0–100.0)
Platelets: 149 K/uL — ABNORMAL LOW (ref 150–400)
RBC: 4.83 MIL/uL (ref 4.22–5.81)
RDW: 14.7 % (ref 11.5–15.5)
WBC: 4 K/uL (ref 4.0–10.5)
nRBC: 0 % (ref 0.0–0.2)

## 2024-11-04 LAB — SURGICAL PCR SCREEN
MRSA, PCR: NEGATIVE
Staphylococcus aureus: NEGATIVE

## 2024-11-04 LAB — TYPE AND SCREEN
ABO/RH(D): O NEG
Antibody Screen: NEGATIVE

## 2024-11-04 LAB — PROTIME-INR
INR: 1.1 (ref 0.8–1.2)
Prothrombin Time: 15 s (ref 11.4–15.2)

## 2024-11-04 NOTE — Progress Notes (Addendum)
 All consents signed by patient at PAT lab appointment. Pt was sent home with printed copy of surgical instructions and CHG soap/CHG soap instructions. All instructions reviewed with patient and questions answered.  Patients chart send to anesthesia for review. Pt denies any respiratory illness/infection in the last two months.   Joshua Hummer, PA  made aware of urine results with large number of leukocytes

## 2024-11-07 MED ORDER — HEPARIN 30,000 UNITS/1000 ML (OHS) CELLSAVER SOLUTION
Status: DC
  Filled 2024-11-07: qty 1000

## 2024-11-07 MED ORDER — DEXMEDETOMIDINE HCL IN NACL 400 MCG/100ML IV SOLN
0.1000 ug/kg/h | INTRAVENOUS | Status: AC
  Administered 2024-11-08: 11:00:00 97.32 ug via INTRAVENOUS
  Administered 2024-11-08: 11:00:00 .7 ug/kg/h via INTRAVENOUS
  Administered 2024-11-08: 11:00:00 68.12 ug via INTRAVENOUS
  Filled 2024-11-07: qty 100

## 2024-11-07 MED ORDER — NOREPINEPHRINE 4 MG/250ML-% IV SOLN
0.0000 ug/min | INTRAVENOUS | Status: AC
  Administered 2024-11-08: 12:00:00 2 ug/min via INTRAVENOUS
  Filled 2024-11-07: qty 250

## 2024-11-07 MED ORDER — MAGNESIUM SULFATE 50 % IJ SOLN
40.0000 meq | INTRAMUSCULAR | Status: DC
  Filled 2024-11-07 (×2): qty 9.85

## 2024-11-07 MED ORDER — CEFAZOLIN SODIUM-DEXTROSE 2-4 GM/100ML-% IV SOLN
2.0000 g | INTRAVENOUS | Status: AC
  Administered 2024-11-08: 11:00:00 2 g via INTRAVENOUS
  Filled 2024-11-07: qty 100

## 2024-11-07 MED ORDER — POTASSIUM CHLORIDE 2 MEQ/ML IV SOLN
80.0000 meq | INTRAVENOUS | Status: DC
  Filled 2024-11-07: qty 40

## 2024-11-08 ENCOUNTER — Inpatient Hospital Stay (HOSPITAL_COMMUNITY)

## 2024-11-08 ENCOUNTER — Encounter (HOSPITAL_COMMUNITY): Payer: Self-pay | Admitting: Cardiovascular Disease

## 2024-11-08 ENCOUNTER — Inpatient Hospital Stay (HOSPITAL_COMMUNITY): Payer: Self-pay | Admitting: Physician Assistant

## 2024-11-08 ENCOUNTER — Inpatient Hospital Stay (HOSPITAL_COMMUNITY): Payer: Self-pay | Admitting: Anesthesiology

## 2024-11-08 ENCOUNTER — Other Ambulatory Visit: Payer: Self-pay

## 2024-11-08 ENCOUNTER — Inpatient Hospital Stay (HOSPITAL_COMMUNITY)
Admission: RE | Admit: 2024-11-08 | Discharge: 2024-11-09 | DRG: 267 | Disposition: A | Attending: Cardiovascular Disease | Admitting: Cardiovascular Disease

## 2024-11-08 ENCOUNTER — Encounter (HOSPITAL_COMMUNITY): Admission: RE | Disposition: A | Payer: Self-pay | Source: Home / Self Care | Attending: Cardiovascular Disease

## 2024-11-08 DIAGNOSIS — I4892 Unspecified atrial flutter: Secondary | ICD-10-CM

## 2024-11-08 DIAGNOSIS — Z7982 Long term (current) use of aspirin: Secondary | ICD-10-CM

## 2024-11-08 DIAGNOSIS — G3184 Mild cognitive impairment, so stated: Secondary | ICD-10-CM | POA: Diagnosis not present

## 2024-11-08 DIAGNOSIS — Z006 Encounter for examination for normal comparison and control in clinical research program: Secondary | ICD-10-CM | POA: Diagnosis not present

## 2024-11-08 DIAGNOSIS — Z952 Presence of prosthetic heart valve: Secondary | ICD-10-CM | POA: Diagnosis not present

## 2024-11-08 DIAGNOSIS — E7800 Pure hypercholesterolemia, unspecified: Secondary | ICD-10-CM | POA: Diagnosis present

## 2024-11-08 DIAGNOSIS — I350 Nonrheumatic aortic (valve) stenosis: Principal | ICD-10-CM

## 2024-11-08 DIAGNOSIS — Z79899 Other long term (current) drug therapy: Secondary | ICD-10-CM

## 2024-11-08 DIAGNOSIS — K869 Disease of pancreas, unspecified: Secondary | ICD-10-CM | POA: Diagnosis present

## 2024-11-08 DIAGNOSIS — I10 Essential (primary) hypertension: Secondary | ICD-10-CM | POA: Diagnosis present

## 2024-11-08 DIAGNOSIS — N400 Enlarged prostate without lower urinary tract symptoms: Secondary | ICD-10-CM | POA: Diagnosis not present

## 2024-11-08 DIAGNOSIS — I4891 Unspecified atrial fibrillation: Secondary | ICD-10-CM | POA: Diagnosis not present

## 2024-11-08 HISTORY — PX: INTRAOPERATIVE TRANSTHORACIC ECHOCARDIOGRAM: SHX6523

## 2024-11-08 LAB — ECHOCARDIOGRAM LIMITED
AR max vel: 2.36 cm2
AV Area VTI: 2.97 cm2
AV Area mean vel: 2.6 cm2
AV Mean grad: 3 mmHg
AV Peak grad: 6.3 mmHg
Ao pk vel: 1.25 m/s
Single Plane A4C EF: 45 %

## 2024-11-08 LAB — POCT I-STAT, CHEM 8
BUN: 14 mg/dL (ref 8–23)
Calcium, Ion: 1.19 mmol/L (ref 1.15–1.40)
Chloride: 106 mmol/L (ref 98–111)
Creatinine, Ser: 1 mg/dL (ref 0.61–1.24)
Glucose, Bld: 143 mg/dL — ABNORMAL HIGH (ref 70–99)
HCT: 42 % (ref 39.0–52.0)
Hemoglobin: 14.3 g/dL (ref 13.0–17.0)
Potassium: 4.2 mmol/L (ref 3.5–5.1)
Sodium: 140 mmol/L (ref 135–145)
TCO2: 25 mmol/L (ref 22–32)

## 2024-11-08 LAB — ABO/RH: ABO/RH(D): O NEG

## 2024-11-08 LAB — POCT ACTIVATED CLOTTING TIME: Activated Clotting Time: 255 s

## 2024-11-08 SURGERY — TRANSCATHETER AORTIC VALVE REPLACEMENT, TRANSFEMORAL (CATHLAB)
Anesthesia: Monitor Anesthesia Care | Laterality: Right

## 2024-11-08 MED ORDER — SODIUM CHLORIDE 0.9 % IV SOLN: INTRAVENOUS | Status: DC | PRN

## 2024-11-08 MED ORDER — MORPHINE SULFATE (PF) 2 MG/ML IV SOLN: 1.0000 mg | INTRAVENOUS | Status: DC | PRN

## 2024-11-08 MED ORDER — SODIUM CHLORIDE 0.9% FLUSH
3.0000 mL | Freq: Two times a day (BID) | INTRAVENOUS | Status: DC
  Administered 2024-11-08 – 2024-11-09 (×2): 3 mL via INTRAVENOUS

## 2024-11-08 MED ORDER — ACETAMINOPHEN 325 MG PO TABS: 650.0000 mg | ORAL_TABLET | Freq: Four times a day (QID) | ORAL | Status: DC | PRN

## 2024-11-08 MED ORDER — CHLORHEXIDINE GLUCONATE 4 % EX SOLN: 30.0000 mL | CUTANEOUS | Status: DC

## 2024-11-08 MED ORDER — ASPIRIN 81 MG PO TBEC
81.0000 mg | DELAYED_RELEASE_TABLET | Freq: Every day | ORAL | Status: DC
  Administered 2024-11-09: 08:00:00 81 mg via ORAL
  Filled 2024-11-08: qty 1

## 2024-11-08 MED ORDER — ACETAMINOPHEN 500 MG PO TABS
1000.0000 mg | ORAL_TABLET | Freq: Once | ORAL | Status: AC
  Administered 2024-11-08: 10:00:00 1000 mg via ORAL
  Filled 2024-11-08: qty 2

## 2024-11-08 MED ORDER — LIDOCAINE HCL (PF) 1 % IJ SOLN
INTRAMUSCULAR | Status: DC | PRN
  Administered 2024-11-08 (×2): 5 mL

## 2024-11-08 MED ORDER — LIDOCAINE HCL (PF) 1 % IJ SOLN
INTRAMUSCULAR | Status: AC
  Filled 2024-11-08: qty 30

## 2024-11-08 MED ORDER — CHLORHEXIDINE GLUCONATE 4 % EX SOLN: 60.0000 mL | Freq: Once | CUTANEOUS | Status: DC

## 2024-11-08 MED ORDER — SODIUM CHLORIDE 0.9 % IV SOLN: 250.0000 mL | INTRAVENOUS | Status: DC | PRN

## 2024-11-08 MED ORDER — SODIUM CHLORIDE 0.9 % IV SOLN: INTRAVENOUS | Status: DC

## 2024-11-08 MED ORDER — CLEVIDIPINE BUTYRATE 0.5 MG/ML IV EMUL
INTRAVENOUS | Status: DC | PRN
  Administered 2024-11-08: 12:00:00 5 mg/h via INTRAVENOUS

## 2024-11-08 MED ORDER — CEFAZOLIN SODIUM-DEXTROSE 2-4 GM/100ML-% IV SOLN
2.0000 g | Freq: Three times a day (TID) | INTRAVENOUS | Status: AC
  Administered 2024-11-08 – 2024-11-09 (×2): 2 g via INTRAVENOUS
  Filled 2024-11-08 (×2): qty 100

## 2024-11-08 MED ORDER — ONDANSETRON HCL 4 MG/2ML IJ SOLN
4.0000 mg | Freq: Four times a day (QID) | INTRAMUSCULAR | Status: DC | PRN
  Administered 2024-11-08: 23:00:00 4 mg via INTRAVENOUS
  Filled 2024-11-08: qty 2

## 2024-11-08 MED ORDER — NOREPINEPHRINE 4 MG/250ML-% IV SOLN: 0.0000 ug/min | INTRAVENOUS | Status: DC

## 2024-11-08 MED ORDER — FINASTERIDE 5 MG PO TABS
5.0000 mg | ORAL_TABLET | Freq: Every day | ORAL | Status: DC
  Administered 2024-11-09: 08:00:00 5 mg via ORAL
  Filled 2024-11-08: qty 1

## 2024-11-08 MED ORDER — NITROGLYCERIN IN D5W 200-5 MCG/ML-% IV SOLN: 0.0000 ug/min | INTRAVENOUS | Status: DC

## 2024-11-08 MED ORDER — CHLORHEXIDINE GLUCONATE 4 % EX SOLN
60.0000 mL | Freq: Once | CUTANEOUS | Status: DC
  Filled 2024-11-08: qty 60

## 2024-11-08 MED ORDER — OXYCODONE HCL 5 MG PO TABS: 5.0000 mg | ORAL_TABLET | ORAL | Status: DC | PRN

## 2024-11-08 MED ORDER — PROTAMINE SULFATE 10 MG/ML IV SOLN
INTRAVENOUS | Status: DC | PRN
  Administered 2024-11-08: 12:00:00 50 mg via INTRAVENOUS

## 2024-11-08 MED ORDER — IODIXANOL 320 MG/ML IV SOLN
INTRAVENOUS | Status: DC | PRN
  Administered 2024-11-08: 12:00:00 50 mL via INTRA_ARTERIAL

## 2024-11-08 MED ORDER — HEPARIN SODIUM (PORCINE) 1000 UNIT/ML IJ SOLN
INTRAMUSCULAR | Status: DC | PRN
  Administered 2024-11-08: 12:00:00 15000 [IU] via INTRAVENOUS

## 2024-11-08 MED ORDER — SODIUM CHLORIDE 0.9% FLUSH: 3.0000 mL | INTRAVENOUS | Status: DC | PRN

## 2024-11-08 MED ORDER — CHLORHEXIDINE GLUCONATE 0.12 % MT SOLN
15.0000 mL | Freq: Once | OROMUCOSAL | Status: AC
  Administered 2024-11-08: 10:00:00 15 mL via OROMUCOSAL
  Filled 2024-11-08: qty 15

## 2024-11-08 MED ORDER — TAMSULOSIN HCL 0.4 MG PO CAPS
0.4000 mg | ORAL_CAPSULE | Freq: Every day | ORAL | Status: DC
  Administered 2024-11-08 – 2024-11-09 (×2): 0.4 mg via ORAL
  Filled 2024-11-08 (×2): qty 1

## 2024-11-08 MED ORDER — TRAMADOL HCL 50 MG PO TABS
50.0000 mg | ORAL_TABLET | ORAL | Status: DC | PRN
  Filled 2024-11-08: qty 2

## 2024-11-08 MED ORDER — ACETAMINOPHEN 650 MG RE SUPP: 650.0000 mg | Freq: Four times a day (QID) | RECTAL | Status: DC | PRN

## 2024-11-08 SURGICAL SUPPLY — 29 items
BAG SNAP BAND KOVER 36X36 (MISCELLANEOUS) ×4 IMPLANT
CABLE ADAPT PACING TEMP 12FT (ADAPTER) IMPLANT
CATH COMMANDER DELIVERY SYS 29 (CATHETERS) IMPLANT
CATH DIAG 6FR PIGTAIL ANGLED (CATHETERS) IMPLANT
CATH INFINITI 5FR ANG PIGTAIL (CATHETERS) IMPLANT
CATH INFINITI 6F AL2 (CATHETERS) IMPLANT
CATH S G BIP PACING (CATHETERS) IMPLANT
CLOSURE MYNX CONTROL 6F/7F (Vascular Products) IMPLANT
CLOSURE PERCLOSE PROSTYLE (Vascular Products) IMPLANT
CRIMPER (MISCELLANEOUS) IMPLANT
DEVICE INFLATION ATRION QL38 (MISCELLANEOUS) IMPLANT
KIT MICROPUNCTURE NIT STIFF (SHEATH) IMPLANT
KIT SAPIAN 3 ULTRA RESILIA 29 (Valve) IMPLANT
PACK CARDIAC CATHETERIZATION (CUSTOM PROCEDURE TRAY) ×2 IMPLANT
PAD SORBX EP SHIELD 16.5X12 (MISCELLANEOUS) IMPLANT
SET ATX-X65L (MISCELLANEOUS) IMPLANT
SHEATH BRITE TIP 7FR 35CM (SHEATH) IMPLANT
SHEATH INTRODUCER SET 29 (SHEATH) IMPLANT
SHEATH PINNACLE 6F 10CM (SHEATH) IMPLANT
SHEATH PINNACLE 8F 10CM (SHEATH) IMPLANT
SHIELD CATH-GARD CONTAMINATION (MISCELLANEOUS) IMPLANT
STOPCOCK MORSE 400PSI 3WAY (MISCELLANEOUS) ×4 IMPLANT
TRANSDUCER W/STOPCOCK (MISCELLANEOUS) IMPLANT
TUBING ART PRESS 72 MALE/FEM (TUBING) IMPLANT
WIRE AMPLATZ SS-J .035X180CM (WIRE) IMPLANT
WIRE EMERALD 3MM-J .035X150CM (WIRE) IMPLANT
WIRE EMERALD 3MM-J .035X260CM (WIRE) IMPLANT
WIRE EMERALD ST .035X260CM (WIRE) IMPLANT
WIRE SAFARI SM CURVE 275 (WIRE) IMPLANT

## 2024-11-08 NOTE — Discharge Instructions (Signed)

## 2024-11-08 NOTE — Progress Notes (Signed)
°  HE[facility] AND VASCULAR CENTER   MULTIDISCIPLINARY HEART VALVE TEAM  Patient doing well s/p TAVR. He is hemodynamically stable. Groin sites stable. ECG with sinus brady with old 1st deg AV block but no high grade block. Hrs in 40s on tele but BP okay. Plan to transfer to from cath lab holding to 4E when bed available. Early ambulation after bedrest completed and hopeful discharge over the next 24-48 hours.   Lamarr Hummer PA-C  [facility]  Pager [phone]

## 2024-11-08 NOTE — Transfer of Care (Signed)
 Immediate Anesthesia Transfer of Care Note  Patient: Joshua Arroyo  Procedure(s) Performed: Transcatheter Aortic Valve Replacement, Transfemoral (Right) ECHOCARDIOGRAM, TRANSTHORACIC  Patient Location: Cath Lab  Anesthesia Type:MAC  Level of Consciousness: awake, alert , oriented, drowsy, and patient cooperative  Airway & Oxygen Therapy: Patient Spontanous Breathing and Patient connected to nasal cannula oxygen  Post-op Assessment: Report given to RN and Post -op Vital signs reviewed and stable  Post vital signs: Reviewed and stable  Last Vitals:  Vitals Value Taken Time  BP 129/69 1255  Temp    Pulse 53   Resp    SpO2 94%     Last Pain:  Vitals:   11/08/24 1228  TempSrc:   PainSc: Asleep         Complications: There were no known notable events for this encounter.

## 2024-11-08 NOTE — Discharge Summary (Incomplete)
 [facility]ART [facility] VASCULAR CENTER   MULTIDISCIPLINARY HEART VALVE TEAM  Discharge Summary    Patient ID: Joshua Arroyo MRN: [id number]; DOB: [birth year]  Admit date: 11/08/2024 Discharge date: 11/09/2024  PCP:  Shlomo Darryle [name], DO  [facility] Cardiologist:  Arun K Thukkani, MD  [facility] Structural heart: Ozell Fell, MD [facility] Electrophysiologist:  Danelle Birmingham, MD   Discharge Diagnoses    Principal Problem:   S/P TAVR (transcatheter aortic valve replacement) Active Problems:   Mild cognitive impairment   Essential hypertension   Severe aortic stenosis   Atrial flutter (HCC)   Allergies Allergies[1]  Diagnostic Studies/Procedures    TAVR OPERATIVE NOTE     Date of Procedure:                11/08/2024   Preoperative Diagnosis:      Severe Aortic Stenosis    Postoperative Diagnosis:    Same    Procedure:        Transcatheter Aortic Valve Replacement - Percutaneous Transfemoral Approach             29mm Edwards Sapian 3 Ultra Resilia [id number].  Nominal prep              Co-Surgeons:                        Linnie [name], MD and M. Fell, MD   Anesthesiologist:                  General   Echocardiographer:              EMERSON Balding, MD   Pre-operative Echo Findings: Severe aortic stenosis normal left ventricular systolic function     Post-operative Echo Findings: trace paravalvular leak normal left ventricular systolic function   _____________    Echo 11/09/24: completed but pending formal read at the time of discharge   History of Present Illness     Joshua Arroyo is a 83 y.o. male with a history of atrial tachycardia/flutter s/p ablation (2012), HLD, HTN, 1st deg AV block, LV dysfunction and severe AS who presented to [facility]08[facility]24 for planned TAVR.   He has been followed by Dr. Birmingham since remote ablation. He developed exertional fatigue and echo 09/20/24 showed new LV dysfunction (EF 65%--> 40%), mild-mod MR, mild AI and  severe AS with mean grad 38.0 mmHg, Vmax 4.14 m/s, AVA 0.95 cm2, DVI 0.21, SVI 34. L/RHC 09/30/24 showed minimal non obst CAD, wedge pressure mean of 20 mmHg with V waves to 24 mmHg and PA pressure of 38/18 with a mean of 27 mmHg.   The patient was evaluated by the multidisciplinary valve team and felt to have severe, symptomatic aortic stenosis and to be a suitable candidate for TAVR, which was set up for 11/08/24.   Hospital Course     Consultants: none   Severe AS:  -- S/p TAVR with a 29 mm Edwards Sapien 3 Ultra Resilia  THV via the TF approach on 11/08/24.  -- Post operative echo completed but pending formal read. -- Groin sites are stable.  -- ECG with sinus with old first degree block and no high grade heart block. -- Continue Asprin 81mg  daily. -- Met with cardiac rehab to discuss CRP phase II.  -- Plan for discharge home today with close follow up in the outpatient setting.   HTN: -- BP well controlled.  -- Resume home losartan 100mg  daily.  Pancreatic  lesion: -- Pre TAVR CTs noted the previously observed pancreatic tail cyst is similar, accounting for variation in technique. According to prior MRI report dated January 03, 2021, the patient is due for follow-up MRI screening.  -- This will be discussed in the outpatient setting.   TAA: -- Pre TAVR CTs showed aneurysm of the tubular ascending thoracic aorta estimated at 4.3 cm. Recommend annual imaging followup by CTA or MRA. -- This will be discussed in the outpatient setting.  _____________  Discharge Vitals Blood pressure 133/70, pulse 66, temperature 98.2 F (36.8 C), temperature source Oral, resp. rate 16, height 6' (1.829 m), weight 100.6 kg, SpO2 95%.  Filed Weights   11/08/24 0923 11/09/24 0625  Weight: 95.3 kg 100.6 kg     GEN: Well nourished, well developed in no acute distress NECK: No JVD CARDIAC: RRR, no murmurs, rubs, gallops RESPIRATORY:  Clear to auscultation without rales, wheezing or rhonchi   ABDOMEN: Soft, non-tender, non-distended EXTREMITIES:  No edema; No deformity.  Groin sites clear without hematoma or ecchymosis.    Disposition   Pt is being discharged home today in good condition.  Follow-up Plans & Appointments     Follow-up Information     Sebastian Lamarr SAUNDERS, PA-C. Go on 11/14/2024.   Specialties: Cardiology, Radiology Why: @ 11:10am, please arrive at least 20 minutes early Contact information: [address] [place] [address] [phone]                  Discharge Medications   Allergies as of 11/09/2024   No Known Allergies      Medication List     TAKE these medications    aspirin  EC 81 MG tablet Take 81 mg by mouth daily.   CALCIUM + D PO Take by mouth. What changed:  how much to take when to take this   cyanocobalamin 1000 MCG tablet Commonly known as: VITAMIN B12 Take 1,000 mcg by mouth daily.   D3 High Potency 125 MCG (5000 UT) capsule Generic drug: Cholecalciferol Take 5,000 Units by mouth daily.   finasteride  5 MG tablet Commonly known as: PROSCAR  Take 5 mg by mouth daily.   Fish Oil 1000 MG Caps Take 980 mg by mouth daily.   losartan 100 MG tablet Commonly known as: COZAAR Take 100 mg by mouth daily.   LYSINE PO Take by mouth. What changed:  how much to take when to take this   MAGNESIUM  GLYCINATE PO Take 240 mg by mouth daily.   Melatonin 5 MG Chew Chew 5 mg by mouth at bedtime as needed (sleep). Gummie   Nighttime Sleep Aid 50 MG tablet Generic drug: diphenhydrAMINE  HCl (Sleep) Take 50 mg by mouth at bedtime as needed for sleep.   tamsulosin  0.4 MG Caps capsule Commonly known as: FLOMAX  Take 0.4 mg by mouth daily.   vitamin E 180 MG (400 UNITS) capsule Take 400 Units by mouth daily.            Outstanding Labs/Studies   none  ______________________  Duration of Discharge Encounter: APP Time: 20 minutes    Signed, Lamarr Sebastian, PA-C 11/09/2024, 10:10  AM [phone]      [1] No Known Allergies

## 2024-11-08 NOTE — Anesthesia Preprocedure Evaluation (Signed)
 Anesthesia Evaluation  Patient identified by MRN, date of birth, ID band Patient awake    Reviewed: Allergy & Precautions, NPO status , Patient's Chart, lab work & pertinent test results  Airway Mallampati: II  TM Distance: <3 FB Neck ROM: Full    Dental  (+) Teeth Intact, Dental Advisory Given   Pulmonary neg pulmonary ROS   Pulmonary exam normal breath sounds clear to auscultation       Cardiovascular hypertension, Pt. on medications + dysrhythmias (s/p ablation) Atrial Fibrillation + Valvular Problems/Murmurs AS  Rhythm:Regular Rate:Normal + Systolic murmurs    Neuro/Psych S/p ACDF  negative neurological ROS     GI/Hepatic negative GI ROS, Neg liver ROS,,,  Endo/Other  negative endocrine ROS    Renal/GU negative Renal ROS     Musculoskeletal negative musculoskeletal ROS (+)    Abdominal   Peds  Hematology  (+) Blood dyscrasia (Plt 149k)   Anesthesia Other Findings Day of surgery medications reviewed with the patient.  Reproductive/Obstetrics                              Anesthesia Physical Anesthesia Plan  ASA: 4  Anesthesia Plan: MAC   Post-op Pain Management: Tylenol  PO (pre-op)*   Induction: Intravenous  PONV Risk Score and Plan: 1 and TIVA and Treatment may vary due to age or medical condition  Airway Management Planned: Simple Face Mask and Natural Airway  Additional Equipment: Arterial line  Intra-op Plan:   Post-operative Plan:   Informed Consent: I have reviewed the patients History and Physical, chart, labs and discussed the procedure including the risks, benefits and alternatives for the proposed anesthesia with the patient or authorized representative who has indicated his/her understanding and acceptance.     Dental advisory given  Plan Discussed with: CRNA  Anesthesia Plan Comments: (Femoral arterial line from Cardiology )         Anesthesia  Quick Evaluation

## 2024-11-08 NOTE — Progress Notes (Signed)
 Patient arrived from cath lab VSS alert and oriented x 4 CCMD notifed, CHG bath completed, Bed in lowest position with call light in reach current plan of care in progress

## 2024-11-08 NOTE — Op Note (Signed)
 Signed       [facility]   MULTIDISCIPLINARY HEART VALVE TEAM     TAVR OPERATIVE NOTE     Date of Procedure:                11/08/2024   Preoperative Diagnosis:      Severe Aortic Stenosis    Postoperative Diagnosis:    Same    Procedure:        Transcatheter Aortic Valve Replacement - Percutaneous Transfemoral Approach             29mm Edwards Sapian 3 Ultra Resilia [id number].  Nominal prep              Co-Surgeons:                        Linnie [name], MD and M. Wonda, MD   Anesthesiologist:                  General   Echocardiographer:              EMERSON Balding, MD   Pre-operative Echo Findings: Severe aortic stenosis normal left ventricular systolic function    Post-operative Echo Findings: trace paravalvular leak normal left ventricular systolic function      BRIEF CLINICAL NOTE AND INDICATIONS FOR SURGERY   83 y.o. male with severe aortic stenosis.  STS score: 2.93.  NYHA Class 2.  The risks and benefits of transfemoral TAVR were discussed in detail.  We also discussed possibility of an emergent sternotomy to address any procedural complications.  Based on our discussion, we collectively decided that an emergent sternotomy would be indicated.  The patient is agreeable to proceed.  Based on my review of her LHC, echo, and CTA, I agree with the multidisciplinary plan to proceed with a 29 mm SAPIEN TAVR.        DETAILS OF THE OPERATIVE PROCEDURE   PREPARATION:     The patient was brought to the operating room on the above mentioned date and appropriate monitoring was established by the anesthesia team. The patient was placed in the supine position on the operating table.  Intravenous antibiotics were administered. The patient was monitored closely throughout the procedure under conscious sedation.   Baseline transthoracic echocardiogram was performed. The patient's abdomen and both groins were prepped and draped in a sterile manner. A time out  procedure was performed.     PERIPHERAL ACCESS:     Using the modified Seldinger technique, femoral arterial was obtained with placement of 6 Fr sheath on the leftside.  A pigtail diagnostic catheter was passed through the arterial sheath under fluoroscopic guidance into the aortic root. Venous access was from the leftvein.  A temporary transvenous pacemaker catheter was passed through the venous sheath under fluoroscopic guidance into the right ventricle.  The pacemaker was tested to ensure stable lead placement and pacemaker capture. Aortic root angiography was performed in order to determine the optimal angiographic angle for valve deployment.     TRANSFEMORAL ACCESS:    Percutaneous transfemoral access and sheath placement was performed using ultrasound guidance.  The right common femoral artery was cannulated using a micropuncture needle and appropriate location was verified using hand injection angiogram.  A pair of Abbott Perclose percutaneous closure devices were placed and a 6 French sheath replaced into the femoral artery.  The patient was heparinized systemically and ACT verified > 250 seconds.     A 14 Fr  transfemoral E-sheath was introduced into the right common femoral artery after progressively dilating over an Amplatz superstiff wire. An pigtail catheter was used to direct a straight-tip exchange length wire across the native aortic valve into the left ventricle. This was exchanged out for a pigtail catheter and position was confirmed in the LV apex. Simultaneous LV and Ao pressures were recorded.  The pigtail catheter was exchanged for a Safari wire in the LV apex.         TRANSCATHETER HEART VALVE DEPLOYMENT:    An Celestia Sapian 3 UR transcatheter heart valve (size 29 mm) was prepared and crimped per manufacturer's guidelines, and the proper orientation of the valve is confirmed on the [employer] delivery system. The valve was advanced through the introducer sheath using  normal technique until in an appropriate position in the abdominal aorta beyond the sheath tip. The balloon was then retracted and using the fine-tuning wheel was centered on the valve. The valve was then advanced across the aortic arch using appropriate flexion of the catheter. The valve was carefully positioned across the aortic valve annulus. The Commander catheter was retracted using normal technique. Once final position of the valve has been confirmed by angiographic assessment, the valve is deployed during rapid ventricular pacing to maintain systolic blood pressure < 50 mmHg and pulse pressure < 10 mmHg. The balloon inflation is held for >3 seconds after reaching full deployment volume. Once the balloon has fully deflated the balloon is retracted into the ascending aorta and valve function is assessed using echocardiography. There is felt to be trace paravalvular leak and no central aortic insufficiency.  The patient's hemodynamic recovery following valve deployment is good.  The deployment balloon and guidewire are both removed.      PROCEDURE COMPLETION:    The sheath was removed and femoral artery closure performed.  Protamine  was administered once femoral arterial repair was complete. The temporary pacemaker was removed, pigtail catheter and femoral sheaths were removed with manual pressure used for venous hemostasis.  A Mynx femoral closure device was utilized following removal of the diagnostic sheath in the left femoral artery.   The patient tolerated the procedure well and is transported to the cath lab recovery area in stable condition. There were no immediate intraoperative complications. All sponge instrument and needle counts are verified correct at completion of the operation.    No blood products were administered during the operation.   The patient received a total of 40 mL of intravenous contrast during the procedure.

## 2024-11-08 NOTE — Interval H&P Note (Signed)
 History and Physical Interval Note:  11/08/2024 10:55 AM  Joshua Arroyo  has presented today for surgery, with the diagnosis of Severe Aortic Stenosis.  The various methods of treatment have been discussed with the patient and family. After consideration of risks, benefits and other options for treatment, the patient has consented to  Procedures: Transcatheter Aortic Valve Replacement, Transfemoral (Right) ECHOCARDIOGRAM, TRANSTHORACIC (N/A) as a surgical intervention.  The patient's history has been reviewed, patient examined, no change in status, stable for surgery.  I have reviewed the patient's chart and labs.  Questions were answered to the patient's satisfaction.     Ruther Ephraim MALVA [name]

## 2024-11-08 NOTE — Op Note (Signed)
 [facility]   MULTIDISCIPLINARY HEART VALVE TEAM   TAVR OPERATIVE NOTE   Date of Procedure:  11/08/2024  Preoperative Diagnosis: Severe Aortic Stenosis   Postoperative Diagnosis: Same   Procedure:   Transcatheter Aortic Valve Replacement - Percutaneous Transfemoral Approach  Edwards Sapien 3 Ultra Resilia THV (size 29 mm, serial # [id number])   Co-Surgeons:  Linnie [name], MD and Ozell Fell, MD  Anesthesiologist:  Garnette Skillern, MD  Echocardiographer:  Jerel Balding, MD  Pre-operative Echo Findings: Severe aortic stenosis Normal left ventricular systolic function  Post-operative Echo Findings: Trace paravalvular leak Normal/unchanged left ventricular systolic function  BRIEF CLINICAL NOTE AND INDICATIONS FOR SURGERY  83 year old male with severe, symptomatic aortic stenosis.  After multidisciplinary heart team review of his cardiac catheterization, echo, and CTA studies, the patient presents today for transfemoral TAVR.  During the course of the patient's preoperative work up they have been evaluated comprehensively by a multidisciplinary team of specialists coordinated through the Multidisciplinary Heart Valve Clinic in the [facility].  They have been demonstrated to suffer from symptomatic severe aortic stenosis as noted above. The patient has been counseled extensively as to the relative risks and benefits of all options for the treatment of severe aortic stenosis including long term medical therapy, conventional surgery for aortic valve replacement, and transcatheter aortic valve replacement.  The patient has been independently evaluated in formal cardiac surgical consultation by Dr [name], who deemed the patient appropriate for TAVR. Based upon review of all of the patient's preoperative diagnostic tests they are felt to be candidate for transcatheter aortic valve replacement using the transfemoral approach as an alternative  to conventional surgery.    Following the decision to proceed with transcatheter aortic valve replacement, a discussion has been held regarding what types of management strategies would be attempted intraoperatively in the event of life-threatening complications, including whether or not the patient would be considered a candidate for the use of cardiopulmonary bypass and/or conversion to open sternotomy for attempted surgical intervention.  The patient has been advised of a variety of complications that might develop peculiar to this approach including but not limited to risks of death, stroke, paravalvular leak, aortic dissection or other major vascular complications, aortic annulus rupture, device embolization, cardiac rupture or perforation, acute myocardial infarction, arrhythmia, heart block or bradycardia requiring permanent pacemaker placement, congestive heart failure, respiratory failure, renal failure, pneumonia, infection, other late complications related to structural valve deterioration or migration, or other complications that might ultimately cause a temporary or permanent loss of functional independence or other long term morbidity.  The patient provides full informed consent for the procedure as described and all questions were answered preoperatively.  DETAILS OF THE OPERATIVE PROCEDURE  PREPARATION:   The patient is brought to the operating room on the above mentioned date. The patient is placed in the supine position on the operating table.  Intravenous antibiotics are administered. The patient is monitored closely throughout the procedure under conscious sedation.   Baseline transthoracic echocardiogram is performed. The patient's chest, abdomen, both groins, and both lower extremities are prepared and draped in a sterile manner. A time out procedure is performed.   PERIPHERAL ACCESS:   Using ultrasound guidance, femoral arterial and venous access is obtained with placement of 6 Fr  sheaths on the left side.  US  images are digitally captured and stored in the patient's chart. A pigtail diagnostic catheter was passed through the femoral arterial sheath under fluoroscopic guidance into the aortic  root.  A temporary transvenous pacemaker catheter was passed through the femoral venous sheath under fluoroscopic guidance into the right ventricle.  The pacemaker was tested to ensure stable lead placement and pacemaker capture. Aortic root angiography was performed in order to determine the optimal angiographic angle for valve deployment.  TRANSFEMORAL ACCESS:  A micropuncture technique is used to access the right femoral artery under fluoroscopic and ultrasound guidance.  2 Perclose devices are deployed at 10' and 2' positions to 'PreClose' the femoral artery. An 8 French sheath is placed and then an Amplatz Superstiff wire is advanced through the sheath. This is changed out for a 16 French transfemoral E-Sheath after progressively dilating over the Superstiff wire.  An AL-2 catheter was used to direct a straight-tip exchange length wire across the native aortic valve into the left ventricle. This was exchanged out for a pigtail catheter and position was confirmed in the LV apex. Simultaneous LV and Ao pressures were recorded.  The pigtail catheter was exchanged for a Safari wire in the LV apex.    BALLOON AORTIC VALVULOPLASTY:  Not performed  TRANSCATHETER HEART VALVE DEPLOYMENT:  An Edwards Sapien 3 Ultra Resilia transcatheter heart valve (size 29 mm) was prepared and crimped per manufacturer's guidelines, and the proper orientation of the valve is confirmed on the [employer] delivery system. The valve was advanced through the introducer sheath using normal technique until in an appropriate position in the abdominal aorta beyond the sheath tip. The balloon was then retracted and using the fine-tuning wheel was centered on the valve. The valve was then advanced across the aortic arch  using appropriate flexion of the catheter. The valve was carefully positioned across the aortic valve annulus. The Commander catheter was retracted using normal technique. Once final position of the valve has been confirmed by angiographic assessment, the valve is deployed while temporarily holding ventilation and during rapid ventricular pacing to maintain systolic blood pressure < 50 mmHg and pulse pressure < 10 mmHg. The balloon inflation is held for >3 seconds after reaching full deployment volume. Once the balloon has fully deflated the balloon is retracted into the ascending aorta and valve function is assessed using echocardiography. The patient's hemodynamic recovery following valve deployment is good.  The deployment balloon and guidewire are both removed. Echo demostrated acceptable post-procedural gradients, stable mitral valve function, and trace aortic insufficiency.    PROCEDURE COMPLETION:  The sheath was removed and femoral artery closure is performed using the 2 previously deployed Perclose devices.  Protamine  is administered once femoral arterial repair was complete. The site is clear with no evidence of bleeding or hematoma after the sutures are tightened. The temporary pacemaker and pigtail catheters are removed. Mynx closure is used for contralateral femoral arterial hemostasis for the 6 Fr sheath.  The patient tolerated the procedure well and is transported to the recovery area in stable condition. There were no immediate intraoperative complications. All sponge instrument and needle counts are verified correct at completion of the operation.   The patient received a total of 50 mL of intravenous contrast during the procedure.  EBL: minimal  Ozell Fell, MD 11/08/2024 3:50 PM

## 2024-11-09 ENCOUNTER — Encounter (HOSPITAL_COMMUNITY): Payer: Self-pay | Admitting: Cardiovascular Disease

## 2024-11-09 ENCOUNTER — Inpatient Hospital Stay (HOSPITAL_COMMUNITY)

## 2024-11-09 DIAGNOSIS — I10 Essential (primary) hypertension: Secondary | ICD-10-CM | POA: Diagnosis not present

## 2024-11-09 DIAGNOSIS — I4892 Unspecified atrial flutter: Secondary | ICD-10-CM | POA: Diagnosis not present

## 2024-11-09 DIAGNOSIS — Z952 Presence of prosthetic heart valve: Secondary | ICD-10-CM

## 2024-11-09 DIAGNOSIS — I350 Nonrheumatic aortic (valve) stenosis: Secondary | ICD-10-CM | POA: Diagnosis not present

## 2024-11-09 DIAGNOSIS — E7800 Pure hypercholesterolemia, unspecified: Secondary | ICD-10-CM | POA: Diagnosis not present

## 2024-11-09 LAB — ECHOCARDIOGRAM COMPLETE
AR max vel: 2.51 cm2
AV Area VTI: 2.9 cm2
AV Area mean vel: 2.52 cm2
AV Mean grad: 7 mmHg
AV Peak grad: 13.4 mmHg
Ao pk vel: 1.83 m/s
Area-P 1/2: 3.72 cm2
Calc EF: 63.4 %
Height: 72 in
MV M vel: 5.01 m/s
MV Peak grad: 100.4 mmHg
S' Lateral: 3.1 cm
Single Plane A2C EF: 58.8 %
Single Plane A4C EF: 67 %
Weight: 3548.52 [oz_av]

## 2024-11-09 LAB — BASIC METABOLIC PANEL WITH GFR
Anion gap: 8 (ref 5–15)
BUN: 14 mg/dL (ref 8–23)
CO2: 25 mmol/L (ref 22–32)
Calcium: 8.3 mg/dL — ABNORMAL LOW (ref 8.9–10.3)
Chloride: 107 mmol/L (ref 98–111)
Creatinine, Ser: 1.05 mg/dL (ref 0.61–1.24)
GFR, Estimated: >60 mL/min (ref >60)
Glucose, Bld: 94 mg/dL (ref 70–99)
Potassium: 4.7 mmol/L (ref 3.5–5.1)
Sodium: 140 mmol/L (ref 135–145)

## 2024-11-09 LAB — CBC
HCT: 40.9 % (ref 39.0–52.0)
Hemoglobin: 13.6 g/dL (ref 13.0–17.0)
MCH: 30.2 pg (ref 26.0–34.0)
MCHC: 33.3 g/dL (ref 30.0–36.0)
MCV: 90.9 fL (ref 80.0–100.0)
Platelets: 110 K/uL — ABNORMAL LOW (ref 150–400)
RBC: 4.5 MIL/uL (ref 4.22–5.81)
RDW: 14.7 % (ref 11.5–15.5)
WBC: 5.2 K/uL (ref 4.0–10.5)
nRBC: 0 % (ref 0.0–0.2)

## 2024-11-09 LAB — MAGNESIUM: Magnesium: 2.1 mg/dL (ref 1.7–2.4)

## 2024-11-09 MED ORDER — DIPHENHYDRAMINE HCL 25 MG PO CAPS
50.0000 mg | ORAL_CAPSULE | Freq: Once | ORAL | Status: AC
  Administered 2024-11-09: 04:00:00 50 mg via ORAL
  Filled 2024-11-09: qty 2

## 2024-11-09 NOTE — Progress Notes (Signed)
 Patient discharged from unit brought to d/c lounge by nursing staff medications and property returned to designated person. Discharge instructions reviewed and patient questions answered

## 2024-11-09 NOTE — Progress Notes (Signed)
°  Echocardiogram 2D Echocardiogram has been performed.  Koleen KANDICE Popper, RDCS 11/09/2024, 8:17 AM

## 2024-11-09 NOTE — Anesthesia Postprocedure Evaluation (Signed)
 Anesthesia Post Note  Patient: Joshua Arroyo  Procedure(s) Performed: Transcatheter Aortic Valve Replacement, Transfemoral (Right) ECHOCARDIOGRAM, TRANSTHORACIC     Patient location during evaluation: Cath Lab Anesthesia Type: MAC Level of consciousness: awake and alert Pain management: pain level controlled Vital Signs Assessment: post-procedure vital signs reviewed and stable Respiratory status: spontaneous breathing, nonlabored ventilation, respiratory function stable and patient connected to nasal cannula oxygen Cardiovascular status: stable and blood pressure returned to baseline Postop Assessment: no apparent nausea or vomiting Anesthetic complications: no   There were no known notable events for this encounter.  Last Vitals:    Last Pain:                 Garnette FORBES Skillern

## 2024-11-09 NOTE — Progress Notes (Signed)
 Discussed with pt restrictions, exercise guidelines, and CRPII. Pt receptive. Will refer to [facility] CRPII. He likes to do resistance training but knows to wait till after his f/u appt. [id number] Aliene Aris BS, ACSM-CEP 11/09/2024 10:33 AM

## 2024-11-10 ENCOUNTER — Telehealth: Payer: Self-pay

## 2024-11-10 NOTE — Telephone Encounter (Signed)
 I left a message on both the home and mobile number asking the patient to call back for their TOC call.

## 2024-11-11 ENCOUNTER — Telehealth (HOSPITAL_COMMUNITY): Payer: Self-pay

## 2024-11-11 NOTE — Telephone Encounter (Signed)
 Attempted to call the patient again today for his TOC call and left messages asking to return my calls.

## 2024-11-11 NOTE — Telephone Encounter (Signed)
 Attempted to call patient in regards to Cardiac Rehab - LM on VM

## 2024-11-14 ENCOUNTER — Ambulatory Visit: Attending: Physician Assistant | Admitting: Physician Assistant

## 2024-11-14 ENCOUNTER — Telehealth (HOSPITAL_COMMUNITY): Payer: Self-pay

## 2024-11-14 VITALS — BP 160/86 | HR 75 | Ht 72.0 in | Wt 211.8 lb

## 2024-11-14 DIAGNOSIS — Z952 Presence of prosthetic heart valve: Secondary | ICD-10-CM | POA: Diagnosis not present

## 2024-11-14 DIAGNOSIS — I10 Essential (primary) hypertension: Secondary | ICD-10-CM

## 2024-11-14 DIAGNOSIS — K869 Disease of pancreas, unspecified: Secondary | ICD-10-CM

## 2024-11-14 DIAGNOSIS — R1900 Intra-abdominal and pelvic swelling, mass and lump, unspecified site: Secondary | ICD-10-CM

## 2024-11-14 DIAGNOSIS — I712 Thoracic aortic aneurysm, without rupture, unspecified: Secondary | ICD-10-CM

## 2024-11-14 MED ORDER — CHLORTHALIDONE 25 MG PO TABS: 25.0000 mg | ORAL_TABLET | Freq: Every day | ORAL | 0 refills | Status: DC

## 2024-11-14 NOTE — Progress Notes (Unsigned)
 " [facility]ART AND VASCULAR CENTER   MULTIDISCIPLINARY HEART VALVE CLINIC                                     Cardiology Office Note:    Date:  11/15/2024   ID:  Joshua Arroyo, DOB August 21, 1941, MRN [id number]  PCP:  Shlomo Darryle [name], DO  [facility] Cardiologist:  Arun K Thukkani, MD  [facility] Structural heart: Ozell Fell, MD [facility] Electrophysiologist:  Danelle Birmingham, MD   Referring MD: Shlomo Darryle [name], DO   TOC s/p TAVR  History of Present Illness:    Joshua Arroyo is a 83 y.o. male with a hx of atrial tachycardia/flutter s/p ablation (2012), HLD, HTN, 1st deg AV block, LV dysfunction and severe AS s/p TAVR (11/08/24) who presents to clinic for follow up.   He has been followed by Dr. Birmingham since remote ablation. He developed exertional fatigue and echo 09/20/24 showed new LV dysfunction (EF 65%--> 40%), mild-mod MR, mild AI and severe AS with mean grad 38.0 mmHg, Vmax 4.14 m/s, AVA 0.95 cm2, DVI 0.21, SVI 34. L/RHC 09/30/24 showed minimal non obst CAD, wedge pressure mean of 20 mmHg with V waves to 24 mmHg and PA pressure of 38/18 with a mean of 27 mmHg. S/p TAVR with a 29 mm Edwards Sapien 3 Ultra Resilia THV via the TF approach on 11/08/24. Post operative echo showed EF 55%, mod cLVH, mild MR, normally functioning TAVR with a mean gradient of 7 mmHg and no PVL.  Today the patient presents to clinic for follow up. Here alone. No CP or SOB. Chronic LE edema L>R but no orthopnea or PND. No dizziness or syncope. No blood in stool or urine. No palpitations.   Past Medical History:  Diagnosis Date   Atrial flutter (HCC)    post ablation   BPH (benign prostatic hyperplasia)    Hypercholesteremia    Hypertension    S/P TAVR (transcatheter aortic valve replacement) 11/08/2024   S/p TAVR with a 29 mm Edwards Sapien 3 Ultra Resilia  THV via the TF approach by Dr. Fell and Dr. Shyrl   Severe aortic stenosis      Current Medications: Active Medications[1]    ROS:    Please see the history of present illness.    All other systems reviewed and are negative.  EKGs   EKG Interpretation Date/Time:  Monday November 14 2024 11:09:32 EST Ventricular Rate:  75 PR Interval:  258 QRS Duration:  92 QT Interval:  378 QTC Calculation: 422 R Axis:   -27  Text Interpretation: Sinus rhythm with 1st degree A-V block Incomplete right bundle branch block Minimal voltage criteria for LVH, may be normal variant ( R in aVL ) Septal infarct , age undetermined Confirmed by Sebastian Collar [phone]) on 11/14/2024 11:27:05 AM   Risk Assessment/Calculations:           Physical Exam:    VS:  BP (!) 160/86   Pulse 75   Ht 6' (1.829 m)   Wt 211 lb 12.8 oz (96.1 kg)   SpO2 97%   BMI 28.73 kg/m     Wt Readings from Last 3 Encounters:  11/14/24 211 lb 12.8 oz (96.1 kg)  11/09/24 221 lb 12.5 oz (100.6 kg)  10/28/24 214 lb 9.6 oz (97.3 kg)     GEN: Well nourished, well developed in no acute distress NECK: No  JVD CARDIAC: RRR, no murmurs, rubs, gallops RESPIRATORY:  Clear to auscultation without rales, wheezing or rhonchi  ABDOMEN: Soft, non-tender, non-distended EXTREMITIES: Chronic LE edema, L>R;  Groin sites clear without hematoma or ecchymosis.   ASSESSMENT:    1. S/P TAVR (transcatheter aortic valve replacement)   2. Essential hypertension   3. Pancreatic lesion   4. Thoracic aortic aneurysm without rupture, unspecified part     PLAN:    In order of problems listed above:  Severe AS s/p TAVR:  -- Pt doing well s/p TAVR.  -- ECG with no HAVB.  -- Groin sites healing well.  -- SBE prophylaxis discussed; I have RX'd amoxicillin.  -- Continue Aspirin  81mg  daily. -- Cleared to resume all activities without restriction. -- I will see back for 1 month echo and OV.  HTN: -- BP elevated.  -- Continue losartan 100mg  daily. -- Add chlorthalidone  25 mg daily given LE edema and elevated BPs. I do not think he will require KCL supplementation given  high dose of losartan.  -- BMET at follow up.    Pancreatic lesion: -- Pre TAVR CTs noted the previously observed pancreatic tail cyst is similar, accounting for variation in technique. According to prior MRI report dated January 03, 2021, the patient is due for follow-up MRI screening.  -- Discussed with pt and MRI abdomen scheduled 11/21/24.   TAA: -- Pre TAVR CTs showed aneurysm of the tubular ascending thoracic aorta estimated at 4.3 cm. Recommend annual imaging followup by CTA or MRA. -- This will be followed over time.     Medication Adjustments/Labs and Tests Ordered: Current medicines are reviewed at length with the patient today.  Concerns regarding medicines are outlined above.  Orders Placed This Encounter  Procedures   MR ABDOMEN WWO CONTRAST   EKG 12-Lead   ECHOCARDIOGRAM COMPLETE   Meds ordered this encounter  Medications   chlorthalidone  (HYGROTON ) 25 MG tablet    Sig: Take 1 tablet (25 mg total) by mouth daily.    Dispense:  30 tablet    Refill:  0    Supervising Provider:   WONDA SHARPER [[id number]]    Patient Instructions  Medication Instructions:  Your physician has recommended you make the following change in your medication:  START Amoxicillin 500 mg, take 4 tablets by mouth 1 hour prior to dental procedures and cleanings.    START chlorthalidone  25mg  once a day. *If you need a refill on your cardiac medications before your next appointment, please call your pharmacy*  Lab Work: None needed If you have labs (blood work) drawn today and your tests are completely normal, you will receive your results only by: MyChart Message (if you have MyChart) OR A paper copy in the mail If you have any lab test that is abnormal or we need to change your treatment, we will call you to review the results.  Testing/Procedures: 12/15/24 Your physician has requested that you have an echocardiogram. Echocardiography is a painless test that uses sound waves to create images  of your heart. It provides your doctor with information about the size and shape of your heart and how well your hearts chambers and valves are working. This procedure takes approximately one hour. There are no restrictions for this procedure. Please do NOT wear cologne, perfume, aftershave, or lotions (deodorant is allowed). Please arrive 15 minutes prior to your appointment time.  Please note: We ask at that you not bring children with you during ultrasound (echo/ vascular) testing. Due to  room size and safety concerns, children are not allowed in the ultrasound rooms during exams. Our front office staff cannot provide observation of children in our lobby area while testing is being conducted. An adult accompanying a patient to their appointment will only be allowed in the ultrasound room at the discretion of the ultrasound technician under special circumstances. We apologize for any inconvenience.   Follow-Up: At [facility], you and your health needs are our priority.  As part of our continuing mission to provide you with exceptional heart care, our providers are all part of one team.  This team includes your primary Cardiologist (physician) and Advanced Practice Providers or APPs (Physician Assistants and Nurse Practitioners) who all work together to provide you with the care you need, when you need it.  Your next appointment:   As scheduled on 12/15/24  Provider:   Izetta Hummer, PA-C  We recommend signing up for the patient portal called MyChart.  Sign up information is provided on this After Visit Summary.  MyChart is used to connect with patients for Virtual Visits (Telemedicine).  Patients are able to view lab/test results, encounter notes, upcoming appointments, etc.  Non-urgent messages can be sent to your provider as well.   To learn more about what you can do with MyChart, go to [email or web address].     Other Instructions An MRI has been ordered for your abdomen.  Please call  [phone] to schedule your MRI.     Signed, Joshua Hummer, PA-C  11/15/2024 9:37 AM    [facility]     [1]  Current Meds  Medication Sig   aspirin  EC 81 MG tablet Take 81 mg by mouth daily.   Calcium-Vitamin D-Vitamin K (CALCIUM + D PO) Take by mouth.   chlorthalidone  (HYGROTON ) 25 MG tablet Take 1 tablet (25 mg total) by mouth daily.   Cholecalciferol (D3 HIGH POTENCY) 125 MCG (5000 UT) capsule Take 5,000 Units by mouth daily.   cyanocobalamin (VITAMIN B12) 1000 MCG tablet Take 1,000 mcg by mouth daily.   DiphenhydrAMINE  HCl, Sleep, (NIGHTTIME SLEEP AID) 50 MG tablet Take 50 mg by mouth at bedtime as needed for sleep.   finasteride  (PROSCAR ) 5 MG tablet Take 5 mg by mouth daily.   losartan (COZAAR) 100 MG tablet Take 100 mg by mouth daily.   LYSINE PO Take by mouth.   MAGNESIUM  GLYCINATE PO Take 240 mg by mouth daily.   Melatonin 5 MG CHEW Chew 5 mg by mouth at bedtime as needed (sleep). Gummie   Omega-3 Fatty Acids (FISH OIL) 1000 MG CAPS Take 980 mg by mouth daily.   tamsulosin  (FLOMAX ) 0.4 MG CAPS Take 0.4 mg by mouth daily.   vitamin E 180 MG (400 UNITS) capsule Take 400 Units by mouth daily.   "

## 2024-11-14 NOTE — Patient Instructions (Addendum)
 Medication Instructions:  Your physician has recommended you make the following change in your medication:  START Amoxicillin 500 mg, take 4 tablets by mouth 1 hour prior to dental procedures and cleanings.    START chlorthalidone  25mg  once a day. *If you need a refill on your cardiac medications before your next appointment, please call your pharmacy*  Lab Work: None needed If you have labs (blood work) drawn today and your tests are completely normal, you will receive your results only by: MyChart Message (if you have MyChart) OR A paper copy in the mail If you have any lab test that is abnormal or we need to change your treatment, we will call you to review the results.  Testing/Procedures: 12/15/24 Your physician has requested that you have an echocardiogram. Echocardiography is a painless test that uses sound waves to create images of your heart. It provides your doctor with information about the size and shape of your heart and how well your hearts chambers and valves are working. This procedure takes approximately one hour. There are no restrictions for this procedure. Please do NOT wear cologne, perfume, aftershave, or lotions (deodorant is allowed). Please arrive 15 minutes prior to your appointment time.  Please note: We ask at that you not bring children with you during ultrasound (echo/ vascular) testing. Due to room size and safety concerns, children are not allowed in the ultrasound rooms during exams. Our front office staff cannot provide observation of children in our lobby area while testing is being conducted. An adult accompanying a patient to their appointment will only be allowed in the ultrasound room at the discretion of the ultrasound technician under special circumstances. We apologize for any inconvenience.   Follow-Up: At [facility], you and your health needs are our priority.  As part of our continuing mission to provide you with exceptional heart care, our  providers are all part of one team.  This team includes your primary Cardiologist (physician) and Advanced Practice Providers or APPs (Physician Assistants and Nurse Practitioners) who all work together to provide you with the care you need, when you need it.  Your next appointment:   As scheduled on 12/15/24  Provider:   Izetta Hummer, PA-C  We recommend signing up for the patient portal called MyChart.  Sign up information is provided on this After Visit Summary.  MyChart is used to connect with patients for Virtual Visits (Telemedicine).  Patients are able to view lab/test results, encounter notes, upcoming appointments, etc.  Non-urgent messages can be sent to your provider as well.   To learn more about what you can do with MyChart, go to [email or web address].     Other Instructions An MRI has been ordered for your abdomen. Please call  [phone] to schedule your MRI.

## 2024-11-14 NOTE — Telephone Encounter (Signed)
 Pt called back and wasn't too interested in the cardiac rehab program.  Closed referral.

## 2024-11-21 ENCOUNTER — Ambulatory Visit (HOSPITAL_COMMUNITY)
Admission: RE | Admit: 2024-11-21 | Discharge: 2024-11-21 | Disposition: A | Discharge status: Home / Self Care | Source: Ambulatory Visit | Attending: Physician Assistant | Admitting: Physician Assistant

## 2024-11-21 ENCOUNTER — Other Ambulatory Visit (HOSPITAL_COMMUNITY): Payer: Self-pay

## 2024-11-21 ENCOUNTER — Other Ambulatory Visit: Payer: Self-pay | Admitting: Physician Assistant

## 2024-11-21 DIAGNOSIS — K869 Disease of pancreas, unspecified: Secondary | ICD-10-CM | POA: Diagnosis present

## 2024-11-21 DIAGNOSIS — R1900 Intra-abdominal and pelvic swelling, mass and lump, unspecified site: Secondary | ICD-10-CM | POA: Insufficient documentation

## 2024-11-21 MED ORDER — GADOBUTROL 1 MMOL/ML IV SOLN: 9.0000 mL | Freq: Once | INTRAVENOUS | Status: DC | PRN

## 2024-11-21 MED ORDER — GADOBUTROL 1 MMOL/ML IV SOLN
9.0000 mL | Freq: Once | INTRAVENOUS | Status: AC | PRN
  Administered 2024-11-21: 9 mL via INTRAVENOUS

## 2024-11-23 ENCOUNTER — Ambulatory Visit: Payer: Self-pay

## 2024-12-10 ENCOUNTER — Other Ambulatory Visit: Payer: Self-pay | Admitting: Physician Assistant

## 2024-12-15 ENCOUNTER — Ambulatory Visit
Admission: RE | Admit: 2024-12-15 | Discharge: 2024-12-15 | Disposition: A | Discharge status: Home / Self Care | Attending: Cardiology | Admitting: Cardiology

## 2024-12-15 ENCOUNTER — Ambulatory Visit: Admitting: Physician Assistant

## 2024-12-15 VITALS — BP 130/82 | HR 67 | Ht 72.0 in | Wt 212.6 lb

## 2024-12-15 DIAGNOSIS — Z952 Presence of prosthetic heart valve: Secondary | ICD-10-CM | POA: Diagnosis present

## 2024-12-15 DIAGNOSIS — I10 Essential (primary) hypertension: Secondary | ICD-10-CM | POA: Insufficient documentation

## 2024-12-15 DIAGNOSIS — I712 Thoracic aortic aneurysm, without rupture, unspecified: Secondary | ICD-10-CM

## 2024-12-15 DIAGNOSIS — K869 Disease of pancreas, unspecified: Secondary | ICD-10-CM

## 2024-12-15 LAB — BASIC METABOLIC PANEL WITH GFR
BUN/Creatinine Ratio: 16 (ref 10–24)
BUN: 16 mg/dL (ref 8–27)
CO2: 25 mmol/L (ref 20–29)
Calcium: 9.1 mg/dL (ref 8.6–10.2)
Chloride: 100 mmol/L (ref 96–106)
Creatinine, Ser: 0.98 mg/dL (ref 0.76–1.27)
Glucose: 83 mg/dL (ref 70–99)
Potassium: 4.1 mmol/L (ref 3.5–5.2)
Sodium: 139 mmol/L (ref 134–144)
eGFR: 77 mL/min/1.73

## 2024-12-15 MED ORDER — AMOXICILLIN 500 MG PO TABS: 2000.0000 mg | ORAL_TABLET | ORAL | 12 refills | Status: AC

## 2024-12-15 MED ORDER — LOSARTAN POTASSIUM 25 MG PO TABS: 25.0000 mg | ORAL_TABLET | Freq: Every day | ORAL | 3 refills | Status: AC

## 2024-12-15 NOTE — Patient Instructions (Addendum)
 Medication Instructions:  Your physician has recommended you make the following change in your medication:  START taking Losartan  25mg  daily START Amoxicillin  500 mg, take 4 tablets by mouth 1 hour prior to dental procedures and cleanings.   *If you need a refill on your cardiac medications before your next appointment, please call your pharmacy*  Lab Work: 12/15/2024 BMET If you have labs (blood work) drawn today and your tests are completely normal, you will receive your results only by: MyChart Message (if you have MyChart) OR A paper copy in the mail If you have any lab test that is abnormal or we need to change your treatment, we will call you to review the results.  Testing/Procedures: 11/02/2025 Your physician has requested that you have an echocardiogram. Echocardiography is a painless test that uses sound waves to create images of your heart. It provides your doctor with information about the size and shape of your heart and how well your hearts chambers and valves are working. This procedure takes approximately one hour. There are no restrictions for this procedure. Please do NOT wear cologne, perfume, aftershave, or lotions (deodorant is allowed). Please arrive 15 minutes prior to your appointment time.  Please note: We ask at that you not bring children with you during ultrasound (echo/ vascular) testing. Due to room size and safety concerns, children are not allowed in the ultrasound rooms during exams. Our front office staff cannot provide observation of children in our lobby area while testing is being conducted. An adult accompanying a patient to their appointment will only be allowed in the ultrasound room at the discretion of the ultrasound technician under special circumstances. We apologize for any inconvenience.   Follow-Up: At [facility], you and your health needs are our priority.  As part of our continuing mission to provide you with exceptional heart care, our  providers are all part of one team.  This team includes your primary Cardiologist (physician) and Advanced Practice Providers or APPs (Physician Assistants and Nurse Practitioners) who all work together to provide you with the care you need, when you need it.  Your next appointment:   As scheduled on 05/17/2025  Provider:   Dr. Ozell Fell  We recommend signing up for the patient portal called MyChart.  Sign up information is provided on this After Visit Summary.  MyChart is used to connect with patients for Virtual Visits (Telemedicine).  Patients are able to view lab/test results, encounter notes, upcoming appointments, etc.  Non-urgent messages can be sent to your provider as well.   To learn more about what you can do with MyChart, go to [email or web address].

## 2024-12-15 NOTE — Progress Notes (Unsigned)
 " HEART AND VASCULAR CENTER   MULTIDISCIPLINARY HEART VALVE CLINIC                                     Cardiology Office Note:    Date:  12/16/2024   ID:  Joshua Arroyo, DOB [birth year], MRN [id number]  PCP:  Shlomo Darryle [name], DO  [facility] Cardiologist:  Ozell Fell, MD  [facility] Structural heart: Ozell Fell, MD [facility] Electrophysiologist:  Danelle Birmingham, MD   Referring MD: Shlomo Darryle [name], DO   1 month s/p TAVR  History of Present Illness:    Joshua Arroyo is a 84 y.o. male with a hx of atrial tachycardia/flutter s/p ablation (2012), HLD, HTN, 1st deg AV block, LV dysfunction and severe AS s/p TAVR (11/08/24) who presents to clinic for follow up.   He has been followed by Dr. Birmingham since remote ablation. He developed exertional fatigue and echo 09/20/24 showed new LV dysfunction (EF 65%--> 40%), mild-mod MR, mild AI and severe AS with mean grad 38.0 mmHg, Vmax 4.14 m/s, AVA 0.95 cm2, DVI 0.21, SVI 34. L/RHC 09/30/24 showed minimal non obst CAD, wedge pressure mean of 20 mmHg with V waves to 24 mmHg and PA pressure of 38/18 with a mean of 27 mmHg. S/p TAVR with a 29 mm Edwards Sapien 3 Ultra Resilia THV via the TF approach on 11/08/24. Post operative echo showed EF 55%, mod cLVH, mild MR, normally functioning TAVR with a mean gradient of 7 mmHg and no PVL. At post hospital follow up he has some mild swelling and BP was elevated. He was started on chlorthalidone  25 mg daily.  Today the patient presents to clinic for follow up. Here alone. No CP or SOB. Mild chronic LE edema but no orthopnea or PND. No dizziness or syncope. No blood in stool or urine. No palpitations. He was confused and stopped taking losartan  when started on chlorthalidone ; however, BP has been much better controlled. Biggest complaint is dry mouth. Also had a virus recently which had made him feel fatigued.    Was not taking losartna.  Past Medical History:  Diagnosis Date   Atrial flutter  (HCC)    post ablation   BPH (benign prostatic hyperplasia)    Hypercholesteremia    Hypertension    S/P TAVR (transcatheter aortic valve replacement) 11/08/2024   S/p TAVR with a 29 mm Edwards Sapien 3 Ultra Resilia  THV via the TF approach by Dr. Fell and Dr. Shyrl   Severe aortic stenosis      Current Medications: Active Medications[1]    ROS:   Please see the history of present illness.    All other systems reviewed and are negative.  EKGs       Risk Assessment/Calculations:           Physical Exam:    VS:  BP 130/82   Pulse 67   Ht 6' (1.829 m)   Wt 212 lb 9.6 oz (96.4 kg)   SpO2 96%   BMI 28.83 kg/m     Wt Readings from Last 3 Encounters:  12/15/24 212 lb 9.6 oz (96.4 kg)  11/14/24 211 lb 12.8 oz (96.1 kg)  11/09/24 221 lb 12.5 oz (100.6 kg)     GEN: Well nourished, well developed in no acute distress NECK: No JVD CARDIAC: RRR, no murmurs, rubs, gallops RESPIRATORY:  Clear to auscultation without rales,  wheezing or rhonchi  ABDOMEN: Soft, non-tender, non-distended EXTREMITIES: Chronic mild LE edema, L>R;    ASSESSMENT:    1. S/P TAVR (transcatheter aortic valve replacement)   2. Essential hypertension   3. Pancreatic lesion   4. Thoracic aortic aneurysm without rupture, unspecified part     PLAN:    In order of problems listed above:  Severe AS s/p TAVR:  -- Echo today shows EF 55%, mild cLVH, normally functioning TAVR with a mean gradient of 9 mm hg and trivial PVL.  -- NYHA class I.  -- SBE prophylaxis discussed; he has RX'd amoxicillin .  -- Continue Aspirin  81mg  daily. -- I will see back for 1 year echo and OV.  HTN: -- BP well contorllled today. -- Continue chlorthalidone  25 mg daily. (Has misunderstood and stopped talking losartan  100mg  daily). Will start back losartan  at 25mg  daily.  -- BMET today   Pancreatic lesion: -- Pre TAVR CTs noted the previously observed pancreatic tail cyst is similar, accounting for variation  in technique. According to prior MRI report dated January 03, 2021, the patient is due for follow-up MRI screening.  -- Follow up MRI 11/21/25 showed benign lesion with no further work up recommended.   TAA: -- Pre TAVR CTs showed aneurysm of the tubular ascending thoracic aorta estimated at 4.3 cm. Recommend annual imaging followup by CTA or MRA. -- This will be followed over time.     Medication Adjustments/Labs and Tests Ordered: Current medicines are reviewed at length with the patient today.  Concerns regarding medicines are outlined above.  Orders Placed This Encounter  Procedures   Basic Metabolic Panel (BMET)   ECHOCARDIOGRAM COMPLETE   Meds ordered this encounter  Medications   amoxicillin  (AMOXIL ) 500 MG tablet    Sig: Take 4 tablets (2,000 mg total) by mouth as directed. 1 HOUR PRIOR TO DENTAL PROCEDURES, INCLUDING CLEANINGS    Dispense:  12 tablet    Refill:  12   losartan  (COZAAR ) 25 MG tablet    Sig: Take 1 tablet (25 mg total) by mouth daily.    Dispense:  90 tablet    Refill:  3    Patient Instructions  Medication Instructions:  Your physician has recommended you make the following change in your medication:  START taking Losartan  25mg  daily START Amoxicillin  500 mg, take 4 tablets by mouth 1 hour prior to dental procedures and cleanings.   *If you need a refill on your cardiac medications before your next appointment, please call your pharmacy*  Lab Work: 12/15/2024 BMET If you have labs (blood work) drawn today and your tests are completely normal, you will receive your results only by: MyChart Message (if you have MyChart) OR A paper copy in the mail If you have any lab test that is abnormal or we need to change your treatment, we will call you to review the results.  Testing/Procedures: 11/02/2025 Your physician has requested that you have an echocardiogram. Echocardiography is a painless test that uses sound waves to create images of your heart. It  provides your doctor with information about the size and shape of your heart and how well your hearts chambers and valves are working. This procedure takes approximately one hour. There are no restrictions for this procedure. Please do NOT wear cologne, perfume, aftershave, or lotions (deodorant is allowed). Please arrive 15 minutes prior to your appointment time.  Please note: We ask at that you not bring children with you during ultrasound (echo/ vascular) testing. Due to  room size and safety concerns, children are not allowed in the ultrasound rooms during exams. Our front office staff cannot provide observation of children in our lobby area while testing is being conducted. An adult accompanying a patient to their appointment will only be allowed in the ultrasound room at the discretion of the ultrasound technician under special circumstances. We apologize for any inconvenience.   Follow-Up: At [facility], you and your health needs are our priority.  As part of our continuing mission to provide you with exceptional heart care, our providers are all part of one team.  This team includes your primary Cardiologist (physician) and Advanced Practice Providers or APPs (Physician Assistants and Nurse Practitioners) who all work together to provide you with the care you need, when you need it.  Your next appointment:   As scheduled on 05/17/2025  Provider:   Dr. Ozell Fell  We recommend signing up for the patient portal called MyChart.  Sign up information is provided on this After Visit Summary.  MyChart is used to connect with patients for Virtual Visits (Telemedicine).  Patients are able to view lab/test results, encounter notes, upcoming appointments, etc.  Non-urgent messages can be sent to your provider as well.   To learn more about what you can do with MyChart, go to [email or web address].          Signed, Lamarr Hummer, PA-C  12/16/2024 3:20 PM    [facility]     [1]  Current Meds  Medication Sig   amoxicillin  (AMOXIL ) 500 MG tablet Take 4 tablets (2,000 mg total) by mouth as directed. 1 HOUR PRIOR TO DENTAL PROCEDURES, INCLUDING CLEANINGS   aspirin  EC 81 MG tablet Take 81 mg by mouth daily.   Calcium-Vitamin D-Vitamin K (CALCIUM + D PO) Take by mouth.   chlorthalidone  (HYGROTON ) 25 MG tablet TAKE 1 TABLET BY MOUTH DAILY   Cholecalciferol (D3 HIGH POTENCY) 125 MCG (5000 UT) capsule Take 5,000 Units by mouth daily.   cyanocobalamin (VITAMIN B12) 1000 MCG tablet Take 1,000 mcg by mouth daily.   DiphenhydrAMINE  HCl, Sleep, (NIGHTTIME SLEEP AID) 50 MG tablet Take 50 mg by mouth at bedtime as needed for sleep.   finasteride  (PROSCAR ) 5 MG tablet Take 5 mg by mouth daily.   losartan  (COZAAR ) 25 MG tablet Take 1 tablet (25 mg total) by mouth daily.   MAGNESIUM  GLYCINATE PO Take 240 mg by mouth daily.   Melatonin 5 MG CHEW Chew 5 mg by mouth at bedtime as needed (sleep). Gummie   Omega-3 Fatty Acids (FISH OIL) 1000 MG CAPS Take 980 mg by mouth daily.   tamsulosin  (FLOMAX ) 0.4 MG CAPS Take 0.4 mg by mouth daily.   vitamin E 180 MG (400 UNITS) capsule Take 400 Units by mouth daily.   [DISCONTINUED] losartan  (COZAAR ) 100 MG tablet Take 100 mg by mouth daily.   "

## 2024-12-16 ENCOUNTER — Ambulatory Visit: Payer: Self-pay | Admitting: Physician Assistant

## 2024-12-16 LAB — ECHOCARDIOGRAM COMPLETE
AV Mean grad: 9 mmHg
AV Peak grad: 15.9 mmHg
Ao pk vel: 2 m/s
Area-P 1/2: 4.49 cm2
S' Lateral: 3.2 cm

## 2024-12-20 NOTE — Progress Notes (Signed)
 Joshua Arroyo                                          MRN: [id number]   12/20/2024   The [facility] Quality Team Specialist reviewed this patient medical record for the purposes of chart review for care gap closure. The following were reviewed: chart review for care gap closure-controlling blood pressure.    [facility] Quality Team

## 2025-05-17 ENCOUNTER — Ambulatory Visit: Admitting: Cardiovascular Disease

## 2025-11-02 ENCOUNTER — Ambulatory Visit: Admitting: Physician Assistant

## 2025-11-02 ENCOUNTER — Ambulatory Visit (HOSPITAL_COMMUNITY)
